# Patient Record
Sex: Female | Born: 1940 | Race: White | Hispanic: No | Smoking: Never smoker
Health system: Southern US, Community
[De-identification: ages and names within clinical notes are randomized; demographics above are authoritative.]

## PROBLEM LIST (undated history)

## (undated) DIAGNOSIS — G4700 Insomnia, unspecified: Secondary | ICD-10-CM

## (undated) DIAGNOSIS — L905 Scar conditions and fibrosis of skin: Secondary | ICD-10-CM

## (undated) DIAGNOSIS — H18519 Endothelial corneal dystrophy, unspecified eye: Secondary | ICD-10-CM

## (undated) DIAGNOSIS — D692 Other nonthrombocytopenic purpura: Secondary | ICD-10-CM

## (undated) DIAGNOSIS — E785 Hyperlipidemia, unspecified: Secondary | ICD-10-CM

## (undated) DIAGNOSIS — H8110 Benign paroxysmal vertigo, unspecified ear: Secondary | ICD-10-CM

## (undated) DIAGNOSIS — R0789 Other chest pain: Secondary | ICD-10-CM

## (undated) DIAGNOSIS — E538 Deficiency of other specified B group vitamins: Secondary | ICD-10-CM

## (undated) DIAGNOSIS — E161 Other hypoglycemia: Secondary | ICD-10-CM

## (undated) DIAGNOSIS — R42 Dizziness and giddiness: Secondary | ICD-10-CM

## (undated) DIAGNOSIS — N3281 Overactive bladder: Secondary | ICD-10-CM

## (undated) DIAGNOSIS — I10 Essential (primary) hypertension: Secondary | ICD-10-CM

## (undated) DIAGNOSIS — R002 Palpitations: Secondary | ICD-10-CM

## (undated) DIAGNOSIS — T792XXA Traumatic secondary and recurrent hemorrhage and seroma, initial encounter: Secondary | ICD-10-CM

## (undated) DIAGNOSIS — C4491 Basal cell carcinoma of skin, unspecified: Secondary | ICD-10-CM

## (undated) DIAGNOSIS — I872 Venous insufficiency (chronic) (peripheral): Secondary | ICD-10-CM

## (undated) DIAGNOSIS — M8580 Other specified disorders of bone density and structure, unspecified site: Secondary | ICD-10-CM

## (undated) DIAGNOSIS — M542 Cervicalgia: Secondary | ICD-10-CM

## (undated) DIAGNOSIS — K2970 Gastritis, unspecified, without bleeding: Secondary | ICD-10-CM

## (undated) DIAGNOSIS — I351 Nonrheumatic aortic (valve) insufficiency: Secondary | ICD-10-CM

## (undated) DIAGNOSIS — R293 Abnormal posture: Secondary | ICD-10-CM

## (undated) DIAGNOSIS — I480 Paroxysmal atrial fibrillation: Secondary | ICD-10-CM

## (undated) DIAGNOSIS — M791 Myalgia, unspecified site: Secondary | ICD-10-CM

## (undated) HISTORY — DX: Nonrheumatic aortic (valve) insufficiency: I35.1

## (undated) HISTORY — DX: Other chest pain: R07.89

## (undated) HISTORY — DX: Insomnia, unspecified: G47.00

## (undated) HISTORY — DX: Other nonthrombocytopenic purpura: D69.2

## (undated) HISTORY — DX: Gastritis, unspecified, without bleeding: K29.70

## (undated) HISTORY — DX: Traumatic secondary and recurrent hemorrhage and seroma, initial encounter: T79.2XXA

## (undated) HISTORY — DX: Overactive bladder: N32.81

## (undated) HISTORY — DX: Endothelial corneal dystrophy, unspecified eye: H18.519

## (undated) HISTORY — DX: Myalgia, unspecified site: M79.10

## (undated) HISTORY — DX: Abnormal posture: R29.3

## (undated) HISTORY — DX: Venous insufficiency (chronic) (peripheral): I87.2

## (undated) HISTORY — DX: Deficiency of other specified B group vitamins: E53.8

## (undated) HISTORY — DX: Essential (primary) hypertension: I10

## (undated) HISTORY — DX: Cervicalgia: M54.2

## (undated) HISTORY — DX: Other specified disorders of bone density and structure, unspecified site: M85.80

## (undated) HISTORY — DX: Hyperlipidemia, unspecified: E78.5

## (undated) HISTORY — DX: Other hypoglycemia: E16.1

## (undated) HISTORY — DX: Benign paroxysmal vertigo, unspecified ear: H81.10

## (undated) HISTORY — PX: AUGMENTATION MAMMAPLASTY: SUR837

## (undated) HISTORY — PX: THYROIDECTOMY, PARTIAL: SHX18

## (undated) HISTORY — DX: Scar conditions and fibrosis of skin: L90.5

## (undated) HISTORY — DX: Palpitations: R00.2

## (undated) HISTORY — DX: Dizziness and giddiness: R42

## (undated) HISTORY — DX: Morbid (severe) obesity due to excess calories: E66.01

## (undated) HISTORY — DX: Paroxysmal atrial fibrillation: I48.0

## (undated) HISTORY — DX: Basal cell carcinoma of skin, unspecified: C44.91

---

## 2020-06-18 LAB — COLOGUARD: COLOGUARD: NEGATIVE

## 2021-11-24 ENCOUNTER — Telehealth: Payer: Self-pay

## 2021-11-24 NOTE — Telephone Encounter (Signed)
Notes scanned to referal

## 2021-11-24 NOTE — Telephone Encounter (Signed)
NOTES SCANNED TO REFERRAL 

## 2021-12-01 ENCOUNTER — Encounter: Payer: Self-pay | Admitting: Internal Medicine

## 2021-12-06 ENCOUNTER — Encounter: Payer: Self-pay | Admitting: Internal Medicine

## 2021-12-06 ENCOUNTER — Other Ambulatory Visit: Payer: Self-pay

## 2021-12-06 ENCOUNTER — Non-Acute Institutional Stay: Payer: Medicare PPO | Admitting: Internal Medicine

## 2021-12-06 VITALS — BP 132/86 | HR 66 | Temp 99.1°F | Wt 133.8 lb

## 2021-12-06 DIAGNOSIS — N3281 Overactive bladder: Secondary | ICD-10-CM

## 2021-12-06 DIAGNOSIS — E538 Deficiency of other specified B group vitamins: Secondary | ICD-10-CM

## 2021-12-06 DIAGNOSIS — M810 Age-related osteoporosis without current pathological fracture: Secondary | ICD-10-CM | POA: Diagnosis not present

## 2021-12-06 DIAGNOSIS — H8110 Benign paroxysmal vertigo, unspecified ear: Secondary | ICD-10-CM | POA: Diagnosis not present

## 2021-12-06 DIAGNOSIS — I480 Paroxysmal atrial fibrillation: Secondary | ICD-10-CM

## 2021-12-06 DIAGNOSIS — H18519 Endothelial corneal dystrophy, unspecified eye: Secondary | ICD-10-CM

## 2021-12-06 NOTE — Progress Notes (Signed)
Location:  Occupational psychologist of Service:  Clinic (12)  Provider:   Code Status:  Goals of Care: No flowsheet data found.   Chief Complaint  Patient presents with   Medication Refill    No new patient packet and or records received.   Patient here today to establish care.    Quality Metric Gaps    Hepatitis C Screening (Once)    Zoster Vaccines- Shingrix (1 of 2) COVID-19 Vaccine (4 - Booster for Moderna series) INFLUENZA VACCINE      HPI: Patient is a 81 y.o. female seen today for medical management of chronic diseases.    Patient has h/o  PAF Symptoms of palpitations and light headedness, no recurrence since diagnosis on monitor and admission to [facility] 5/26-5/27/17 - Stress test suggestive of attenuation, no ischemia or infarct - Dronderone 400mg  PO BID was successful in suppression but resulted in muscle weakness and fatigue EF 55-60% - Mild MR, mild to mod AR - Doing well on Eliquis Flecainide and Toprol Has Appointment with Dr Harrington Challenger to establish Also has h/o Osteopenia has taken Fosamax and Actonel before H/o Vit D and Vit b12 def H/o s/p Partial Thyroidectomy H/o Corneal Dystropy Recent move to [facility] from [place] No Complains today Walks with no assist No Falls Driving.   Past Medical History:  Diagnosis Date   Abnormal posture    Aortic incompetence    Basal cell carcinoma    BPPV (benign paroxysmal positional vertigo)    Chest discomfort    Cobalamin deficiency    Fuchs' corneal dystrophy    Gastritis    HTN (hypertension)    Hyperlipidemia    Insomnia    Light headed    Morbid obesity ([place])    Neck pain    Nonrheumatic aortic (valve) insufficiency    Osteopenia    Overactive bladder    Palpitations    Paroxysmal atrial fibrillation (HCC)    Reactive hypoglycemia    Scar of lip    Senile purpura (HCC)    Seroma due to trauma (HCC)    Tendon pain    Venous insufficiency     History reviewed. No pertinent  surgical history.  Allergies  Allergen Reactions   Codeine Nausea And Vomiting, Other (See Comments) and Nausea Only    Other reaction(s): GI Intolerance Other reaction(s): GI Intolerance Other reaction(s): GI Intolerance    Penicillins Rash and Other (See Comments)    Other reaction(s): Unknown Other reaction(s): Unknown Other reaction(s): Unknown     Outpatient Encounter Medications as of 12/06/2021  Medication Sig   apixaban (ELIQUIS) 5 MG TABS tablet Take 5 mg by mouth 2 (two) times daily.   calcium carbonate (OSCAL) 1500 (600 Ca) MG TABS tablet Take by mouth 2 (two) times daily with a meal.   flecainide (TAMBOCOR) 50 MG tablet Take 50 mg by mouth 2 (two) times daily.   metoprolol succinate (TOPROL-XL) 25 MG 24 hr tablet Take 12.5 mg by mouth daily.   [DISCONTINUED] FEXOFENADINE HCL PO Take by mouth.   [DISCONTINUED] risedronate (ACTONEL) 150 MG tablet Take 150 mg by mouth every 30 (thirty) days. with water on empty stomach, nothing by mouth or lie down for next 30 minutes.   No facility-administered encounter medications on file as of 12/06/2021.    Review of Systems:  Review of Systems  Constitutional:  Negative for activity change and appetite change.  HENT: Negative.    Respiratory:  Negative for cough and shortness  of breath.   Cardiovascular:  Negative for leg swelling.  Gastrointestinal:  Negative for constipation.  Genitourinary: Negative.   Musculoskeletal:  Negative for arthralgias, gait problem and myalgias.  Skin: Negative.   Neurological:  Negative for dizziness and weakness.  Psychiatric/Behavioral:  Negative for confusion, dysphoric mood and sleep disturbance.    Health Maintenance  Topic Date Due   Hepatitis C Screening  Never done   Zoster Vaccines- Shingrix (1 of 2) Never done   DEXA SCAN  Never done   COVID-19 Vaccine (4 - Booster for Moderna series) 11/26/2020   INFLUENZA VACCINE  06/28/2021   TETANUS/TDAP  07/11/2027   Pneumonia Vaccine 65+ Years  old  Completed   HPV VACCINES  Aged Out    Physical Exam: Vitals:   12/06/21 0900  BP: 132/86  Pulse: 66  Temp: 99.1 F (37.3 C)  SpO2: 99%  Weight: 133 lb 12.8 oz (60.7 kg)   There is no height or weight on file to calculate BMI. Physical Exam Vitals reviewed.  Constitutional:      Appearance: Normal appearance.  HENT:     Head: Normocephalic.     Nose: Nose normal.     Mouth/Throat:     Mouth: Mucous membranes are moist.     Pharynx: Oropharynx is clear.  Eyes:     Pupils: Pupils are equal, round, and reactive to light.  Cardiovascular:     Rate and Rhythm: Normal rate and regular rhythm.     Pulses: Normal pulses.     Heart sounds: Normal heart sounds. No murmur heard. Pulmonary:     Effort: Pulmonary effort is normal.     Breath sounds: Normal breath sounds.  Abdominal:     General: Abdomen is flat. Bowel sounds are normal.     Palpations: Abdomen is soft.  Musculoskeletal:        General: No swelling.     Cervical back: Neck supple.  Skin:    General: Skin is warm.  Neurological:     General: No focal deficit present.     Mental Status: She is alert and oriented to person, place, and time.  Psychiatric:        Mood and Affect: Mood normal.        Thought Content: Thought content normal.    Labs reviewed: Basic Metabolic Panel: No results for input(s): NA, K, CL, CO2, GLUCOSE, BUN, CREATININE, CALCIUM, MG, PHOS, TSH in the last 8760 hours. Liver Function Tests: No results for input(s): AST, ALT, ALKPHOS, BILITOT, PROT, ALBUMIN in the last 8760 hours. No results for input(s): LIPASE, AMYLASE in the last 8760 hours. No results for input(s): AMMONIA in the last 8760 hours. CBC: No results for input(s): WBC, NEUTROABS, HGB, HCT, MCV, PLT in the last 8760 hours. Lipid Panel: No results for input(s): CHOL, HDL, LDLCALC, TRIG, CHOLHDL, LDLDIRECT in the last 8760 hours. No results found for: HGBA1C  Procedures since last visit: No results  found.  Assessment/Plan 1. PAF (paroxysmal atrial fibrillation) ([place]) Doing well Follow up with Cardiology  2. Age-related osteoporosis without current pathological fracture Has taken Fosamax before - DG BONE DENSITY (DXA); Future  3. Fuchs' corneal dystrophy, unspecified laterality Follows with ophthalmologist  4. h/o BPPV   5. OAB (overactive bladder)   6. h/o Vitamin B 12 deficiency Check the level 7 S/P Thyroidectomy Check TSH   Labs/tests ordered:  * No order type specified * Next appt:  Visit date not found

## 2021-12-06 NOTE — Patient Instructions (Addendum)
I am ordering DEXA scan from [facility]  Address: [address] [place], [place], [place] [address] Phone: [phone]

## 2021-12-28 NOTE — Addendum Note (Signed)
Addended by: Georgina Snell on: 12/28/2021 10:44 AM   Modules accepted: Level of Service

## 2022-01-13 ENCOUNTER — Other Ambulatory Visit: Payer: Self-pay

## 2022-01-13 ENCOUNTER — Ambulatory Visit: Payer: Medicare PPO | Admitting: Internal Medicine

## 2022-01-13 ENCOUNTER — Encounter: Payer: Self-pay | Admitting: Internal Medicine

## 2022-01-13 VITALS — BP 110/78 | HR 66 | Ht 65.0 in | Wt 131.0 lb

## 2022-01-13 DIAGNOSIS — Z79899 Other long term (current) drug therapy: Secondary | ICD-10-CM | POA: Diagnosis not present

## 2022-01-13 DIAGNOSIS — I480 Paroxysmal atrial fibrillation: Secondary | ICD-10-CM

## 2022-01-13 LAB — BASIC METABOLIC PANEL
BUN/Creatinine Ratio: 18 (ref 12–28)
BUN: 17 mg/dL (ref 8–27)
CO2: 27 mmol/L (ref 20–29)
Calcium: 10.1 mg/dL (ref 8.7–10.3)
Chloride: 100 mmol/L (ref 96–106)
Creatinine, Ser: 0.93 mg/dL (ref 0.57–1.00)
Glucose: 83 mg/dL (ref 70–99)
Potassium: 5.2 mmol/L (ref 3.5–5.2)
Sodium: 139 mmol/L (ref 134–144)
eGFR: 63 mL/min/{1.73_m2} (ref >59)

## 2022-01-13 LAB — LIPID PANEL
Chol/HDL Ratio: 1.6 ratio (ref 0.0–4.4)
Cholesterol, Total: 148 mg/dL (ref 100–199)
HDL: 91 mg/dL (ref >39)
LDL Chol Calc (NIH): 43 mg/dL (ref 0–99)
Triglycerides: 68 mg/dL (ref 0–149)
VLDL Cholesterol Cal: 14 mg/dL (ref 5–40)

## 2022-01-13 LAB — CBC
Hematocrit: 40.7 % (ref 34.0–46.6)
Hemoglobin: 13.4 g/dL (ref 11.1–15.9)
MCH: 29.2 pg (ref 26.6–33.0)
MCHC: 32.9 g/dL (ref 31.5–35.7)
MCV: 89 fL (ref 79–97)
Platelets: 227 10*3/uL (ref 150–450)
RBC: 4.59 x10E6/uL (ref 3.77–5.28)
RDW: 13.1 % (ref 11.7–15.4)
WBC: 8.6 10*3/uL (ref 3.4–10.8)

## 2022-01-13 LAB — TSH: TSH: 2.25 u[IU]/mL (ref 0.450–4.500)

## 2022-01-13 NOTE — Patient Instructions (Signed)
Medication Instructions:  Your physician recommends that you continue on your current medications as directed. Please refer to the Current Medication list given to you today.  *If you need a refill on your cardiac medications before your next appointment, please call your pharmacy*   Lab Work: LIPID, CBC, BMET,TSH  If you have labs (blood work) drawn today and your tests are completely normal, you will receive your results only by: [place] (if you have MyChart) OR A paper copy in the mail If you have any lab test that is abnormal or we need to change your treatment, we will call you to review the results.   Testing/Procedures: NONE   Follow-Up: At [facility], you and your health needs are our priority.  As part of our continuing mission to provide you with exceptional heart care, we have created designated Provider Care Teams.  These Care Teams include your primary Cardiologist (physician) and Advanced Practice Providers (APPs -  Physician Assistants and Nurse Practitioners) who all work together to provide you with the care you need, when you need it.  We recommend signing up for the patient portal called "MyChart".  Sign up information is provided on this After Visit Summary.  MyChart is used to connect with patients for Virtual Visits (Telemedicine).  Patients are able to view lab/test results, encounter notes, upcoming appointments, etc.  Non-urgent messages can be sent to your provider as well.   To learn more about what you can do with MyChart, go to [email or web address].    Your next appointment:   9 month(s)  The format for your next appointment:   In Person  Provider:  DR. Dorris Carnes   If primary card or EP is not listed click here to update    :1}    Other Instructions

## 2022-01-13 NOTE — Progress Notes (Signed)
Cardiology Office Note   Date:  01/13/2022   ID:  Cathy Simpson, DOB October 17, 1941, MRN [id number]  PCP:  Virgie Dad, MD  Cardiologist:   Dorris Carnes, MD   Patient presents to establish care    History of Present Illness: Cathy Simpson is a 81 y.o. female with a history of PAF, atypical CP, HTN, Venous insufficiency,   She was previously followed by [facility] [place], [place])    The pt was on Multaq in past   Developed weakness Switched to [place] well on this   Pt had a Negative Lexiscan Myoview in AUg 2022 (done since she was on flecanide for 5 years)  The pt says she feels good   No CP Remote episode in 2016.   No SOB    She walks some    Admits she has to exercise more       Current Meds  Medication Sig   apixaban (ELIQUIS) 5 MG TABS tablet Take 5 mg by mouth 2 (two) times daily.   flecainide (TAMBOCOR) 50 MG tablet Take 50 mg by mouth 2 (two) times daily.   metoprolol succinate (TOPROL-XL) 25 MG 24 hr tablet Take 12.5 mg by mouth daily.   [DISCONTINUED] calcium carbonate (OSCAL) 1500 (600 Ca) MG TABS tablet Take by mouth 2 (two) times daily with a meal.     Allergies:   Codeine and Penicillins   Past Medical History:  Diagnosis Date   Abnormal posture    Aortic incompetence    Basal cell carcinoma    BPPV (benign paroxysmal positional vertigo)    Chest discomfort    Cobalamin deficiency    Fuchs' corneal dystrophy    Gastritis    HTN (hypertension)    Hyperlipidemia    Insomnia    Light headed    Morbid obesity ([place])    Neck pain    Nonrheumatic aortic (valve) insufficiency    Osteopenia    Overactive bladder    Palpitations    Paroxysmal atrial fibrillation (HCC)    Reactive hypoglycemia    Scar of lip    Senile purpura (HCC)    Seroma due to trauma (HCC)    Tendon pain    Venous insufficiency     No past surgical history on file.   Social History:  The patient  reports that she has never smoked. She has never used  smokeless tobacco. She reports current alcohol use. She reports that she does not use drugs.   Family History:  The patient's family history is not on file.    ROS:  Please see the history of present illness. All other systems are reviewed and  Negative to the above problem except as noted.    PHYSICAL EXAM: VS:  BP 110/78    Pulse 66    Ht 5\' 5"  (1.651 m)    Wt 131 lb (59.4 kg)    BMI 21.80 kg/m   GEN: Well nourished, well developed, in no acute distress  HEENT: normal  Neck: no JVD, carotid bruits Cardiac: RRR; no murmurs  No LE edema  Respiratory:  clear to auscultation bilaterally, GI: soft, nontender, nondistended, + BS  No hepatomegaly  MS: no deformity Moving all extremities   Skin: warm and dry, no rash Neuro:  Strength and sensation are intact Psych: euthymic mood, full affect   EKG:  EKG is ordered today.  SR 66 bpm     Septal MI    Cardiac  Testing :   Echo May 2017 ([place])   LVEF 55 to 24^  normal diastolic funciton.  Trace MR   Exercise myoview  June 2017  Small anteiror attenuation   LVEF 50%  Lipid Panel No results found for: CHOL, TRIG, HDL, CHOLHDL, VLDL, LDLCALC, LDLDIRECT    Wt Readings from Last 3 Encounters:  01/13/22 131 lb (59.4 kg)  12/06/21 133 lb 12.8 oz (60.7 kg)      ASSESSMENT AND PLAN:  1  Hx PAF   Doing well   No palpitations   Would check CBC and BMET   2  Blood pressure   BP controlled   3  Hx CP  Denies    F/U in Fall    Current medicines are reviewed at length with the patient today.  The patient does not have concerns regarding medicines.  Signed, Dorris Carnes, MD  01/13/2022 10:18 AM    [address], [place], [place]  [address] Phone: [phone]; Fax: [phone]

## 2022-04-14 ENCOUNTER — Other Ambulatory Visit: Payer: Self-pay

## 2022-04-14 ENCOUNTER — Encounter (HOSPITAL_BASED_OUTPATIENT_CLINIC_OR_DEPARTMENT_OTHER): Payer: Self-pay | Admitting: *Deleted

## 2022-04-14 ENCOUNTER — Emergency Department (HOSPITAL_BASED_OUTPATIENT_CLINIC_OR_DEPARTMENT_OTHER): Payer: Medicare PPO

## 2022-04-14 ENCOUNTER — Emergency Department (HOSPITAL_BASED_OUTPATIENT_CLINIC_OR_DEPARTMENT_OTHER)
Admission: EM | Admit: 2022-04-14 | Discharge: 2022-04-14 | Disposition: A | Discharge status: Home / Self Care | Payer: Medicare PPO | Attending: Emergency Medicine | Admitting: Emergency Medicine

## 2022-04-14 DIAGNOSIS — I4891 Unspecified atrial fibrillation: Secondary | ICD-10-CM | POA: Diagnosis not present

## 2022-04-14 DIAGNOSIS — M79605 Pain in left leg: Secondary | ICD-10-CM | POA: Diagnosis present

## 2022-04-14 DIAGNOSIS — M79662 Pain in left lower leg: Secondary | ICD-10-CM | POA: Diagnosis not present

## 2022-04-14 DIAGNOSIS — Z7901 Long term (current) use of anticoagulants: Secondary | ICD-10-CM | POA: Diagnosis not present

## 2022-04-14 DIAGNOSIS — Z79899 Other long term (current) drug therapy: Secondary | ICD-10-CM | POA: Diagnosis not present

## 2022-04-14 NOTE — Discharge Instructions (Addendum)
Please follow-up with your primary care provider about your visit today and also to discuss your elevated blood pressure.  You may heat and elevate your leg.  Return with any worsening symptoms, especially shortness of breath, palpitations, chest pain, dizziness or loss of consciousness.

## 2022-04-14 NOTE — ED Provider Notes (Signed)
[facility] EM[facility] DEPT Provider Note   CSN: [id number] Arrival date & time: 04/14/22  1545     History  Chief Complaint  Patient presents with   Leg Pain    Cathy Simpson is a 81 y.o. female with a past medical history of atrial fibrillation presenting today with a concern of left lower extremity DVT.  She flew back from [place] on Tuesday and on Wednesday started noting some swelling and pain to her left lower extremity.  Does have a history of venous insufficiency but has never had DVT.  Says the pain is worse when walking.  Denies any shortness of breath, palpitations, chest pain or dizziness.  Of note, on Eliquis for her A-fib.     Leg Pain     Home Medications Prior to Admission medications   Medication Sig Start Date End Date Taking? Authorizing Provider  apixaban (ELIQUIS) 5 MG TABS tablet Take 5 mg by mouth 2 (two) times daily.    [provider]  flecainide (TAMBOCOR) 50 MG tablet Take 50 mg by mouth 2 (two) times daily. 09/12/16   [provider]  metoprolol succinate (TOPROL-XL) 25 MG 24 hr tablet Take 12.5 mg by mouth daily.    [provider]      Allergies    Codeine and Penicillins    Review of Systems   Review of Systems  Physical Exam Updated Vital Signs BP (!) 171/84 (BP Location: Right Arm)   Pulse 68   Temp (!) 97.3 F (36.3 C) (Oral)   Resp 16   Wt 62.6 kg   SpO2 100%   BMI 22.96 kg/m  Physical Exam Vitals and nursing note reviewed.  Constitutional:      Appearance: Normal appearance.  HENT:     Head: Normocephalic and atraumatic.  Eyes:     General: No scleral icterus.    Conjunctiva/sclera: Conjunctivae normal.  Pulmonary:     Effort: Pulmonary effort is normal. No respiratory distress.  Musculoskeletal:     Right lower leg: No edema.     Left lower leg: No edema.     Comments: No appreciable edema to the left lower extremity.  Tenderness to the calf with dorsiflexion, varicose veins  bilaterally.  Mild tenderness to the anterior knee  Skin:    General: Skin is warm and dry.     Findings: No rash.  Neurological:     Mental Status: She is alert.  Psychiatric:        Mood and Affect: Mood normal.    ED Results / Procedures / Treatments   Labs (all labs ordered are listed, but only abnormal results are displayed) Labs Reviewed - No data to display  EKG None  Radiology US Venous Img Lower  Left (DVT Study)  Result Date: 04/14/2022 CLINICAL DATA:  LEFT lower extremity swelling and soreness. Reported history of a recent long flight. EXAM: LEFT LOWER EXTREMITY VENOUS DOPPLER ULTRASOUND TECHNIQUE: Gray-scale sonography with compression, as well as color and duplex ultrasound, were performed to evaluate the deep venous system(s) from the level of the common femoral vein through the popliteal and proximal calf veins. COMPARISON:  None Available. FINDINGS: VENOUS Normal compressibility of the LEFT common femoral, superficial femoral, and popliteal veins, as well as the visualized calf veins. Visualized portions of profunda femoral vein and great saphenous vein unremarkable. No filling defects to suggest DVT on grayscale or color Doppler imaging. Doppler waveforms show normal direction of venous flow, normal respiratory plasticity and response to  augmentation. Limited views of the contralateral common femoral vein are unremarkable. OTHER None. Limitations: none IMPRESSION: No evidence of femoropopliteal DVT within the LEFT lower extremity. Michaelle Birks, MD [facility] Radiology Electronically Signed   By: Michaelle Birks M.D.   On: 04/14/2022 16:48    Procedures Procedures    Medications Ordered in ED Medications - No data to display  ED Course/ Medical Decision Making/ A&P                           Medical Decision Making  This patient presents to the ED for concern of leg swelling and tenderness.  Differential includes but is  not limited to DVT, PAD, venous insufficiency heart failure exacerbation, superficial thrombus, claudication, fracture.     This is not an exhaustive differential.    Past Medical History / Co-morbidities / Social History: A-fib on Eliquis    Physical Exam: Physical exam performed. The pertinent findings include: None  Lab Tests: No labs   Imaging Studies: I ordered imaging studies including DVT study of left lower extremity. I independently visualized and interpreted imaging which showed no abnormalities. I agree with the radiologist interpretation.    Disposition: After consideration of the diagnostic results and the patients response to treatment, I feel that patient does not have an emergent diagnosis that needs to be intervened on today.  DVT study is negative and patient is agreeable to outpatient follow-up as needed.  Low suspicion DVT/PE because she is already on Eliquis anyway.  No appreciable cellulitis or pitting edema.  I suspect patient to be experiencing musculoskeletal pain due to her tour in [place] and uncomfortable positioning on the airplane.  Patient was hypertensive throughout her stay however she says that her blood pressure is always high in the emergency departments and doctors offices.  Says she does not take her blood pressure at home.  I encouraged her to speak with her PCP about elevated blood pressure as it can be dangerous if she walks around with untreated hypertension.  She voiced understanding and will talk to her PCP about it when she follows up about her leg discomfort.  Stable for discharge and ambulated out of the department  Final Clinical Impression(s) / ED Diagnoses Final diagnoses:  Pain of left lower extremity    Rx / DC Orders Results and diagnoses were explained to the patient. Return precautions discussed in full. Patient had no additional questions and expressed complete understanding.   This chart was dictated using voice recognition  software.  Despite best efforts to proofread,  errors can occur which can change the documentation meaning.      Rhae Hammock, PA-C 04/14/22 1710    Malvin Johns, MD 04/14/22 2011

## 2022-04-14 NOTE — ED Triage Notes (Addendum)
Pt traveled back from [place] and arrived Tuesday night and has noted soreness and swelling in left leg and is here to r/o DVT.  No CP or sob. Pt is on elaquis for afib.  Pt notes that the swelling is most in her knee and the pain radiates up and down.

## 2022-04-16 ENCOUNTER — Telehealth: Payer: Self-pay | Admitting: Home Health

## 2022-04-16 DIAGNOSIS — H18509 Unspecified hereditary corneal dystrophies, unspecified eye: Secondary | ICD-10-CM | POA: Insufficient documentation

## 2022-04-16 DIAGNOSIS — E538 Deficiency of other specified B group vitamins: Secondary | ICD-10-CM | POA: Insufficient documentation

## 2022-04-16 DIAGNOSIS — I10 Essential (primary) hypertension: Secondary | ICD-10-CM | POA: Insufficient documentation

## 2022-04-16 DIAGNOSIS — I480 Paroxysmal atrial fibrillation: Secondary | ICD-10-CM | POA: Insufficient documentation

## 2022-04-16 DIAGNOSIS — E890 Postprocedural hypothyroidism: Secondary | ICD-10-CM | POA: Insufficient documentation

## 2022-04-16 DIAGNOSIS — I872 Venous insufficiency (chronic) (peripheral): Secondary | ICD-10-CM | POA: Insufficient documentation

## 2022-04-16 DIAGNOSIS — E785 Hyperlipidemia, unspecified: Secondary | ICD-10-CM | POA: Insufficient documentation

## 2022-04-16 NOTE — Progress Notes (Signed)
Location:  [facility]  POS: Clinic  Provider: Royal Hawthorn, ANP  Code Status:  Goals of Care:     04/18/2022    1:02 PM  Advanced Directives  Does Patient Have a Medical Advance Directive? Yes  Type of Paramedic of [place];Living will  Does patient want to make changes to medical advance directive? No - Patient declined     Chief Complaint  Patient presents with   Acute Visit    Patient returns to the clinic due to Left leg and knee issues.     HPI: Patient is a 81 y.o. female seen today for difficulty walking  She was seen in the ER 04/14/22 for left leg pain after a long flight to [place]. She is on eliquis for CVA risk reduction due to afib. BP was elevated in the ER as well.  DVT was ruled out. No signs of cellulitis were noted. The pain was thought to be musculoskeletal related.  The pain has continued in the left knee area now, some radiation up to the thigh and down to the calf. Mild swelling and warmth at the knee is noted.  Taking tylenol and ice. Minimal improvement. Difficulty walking at night. NO back pain. Pain is associated at the knee and calf area.  No change in B/B habits. No numbness or tingling.  Past Medical History:  Diagnosis Date   Abnormal posture    Aortic incompetence    Basal cell carcinoma    BPPV (benign paroxysmal positional vertigo)    Chest discomfort    Cobalamin deficiency    Fuchs' corneal dystrophy    Gastritis    HTN (hypertension)    Hyperlipidemia    Insomnia    Light headed    Morbid obesity ([place])    Neck pain    Nonrheumatic aortic (valve) insufficiency    Osteopenia    Overactive bladder    Palpitations    Paroxysmal atrial fibrillation (HCC)    Reactive hypoglycemia    Scar of lip    Senile purpura (HCC)    Seroma due to trauma (HCC)    Tendon pain    Venous insufficiency     History reviewed. No pertinent surgical history.  Allergies  Allergen Reactions   Codeine Nausea And  Vomiting, Other (See Comments) and Nausea Only    Other reaction(s): GI Intolerance Other reaction(s): GI Intolerance Other reaction(s): GI Intolerance    Penicillins Rash and Other (See Comments)    Other reaction(s): Unknown Other reaction(s): Unknown Other reaction(s): Unknown     Outpatient Encounter Medications as of 04/18/2022  Medication Sig   apixaban (ELIQUIS) 5 MG TABS tablet Take 5 mg by mouth 2 (two) times daily.   flecainide (TAMBOCOR) 50 MG tablet Take 50 mg by mouth 2 (two) times daily.   metoprolol succinate (TOPROL-XL) 25 MG 24 hr tablet Take 12.5 mg by mouth daily.   predniSONE (DELTASONE) 20 MG tablet Take 1 tablet (20 mg total) by mouth 2 (two) times daily with a meal.   No facility-administered encounter medications on file as of 04/18/2022.    Review of Systems:  Review of Systems  Constitutional:  Positive for activity change. Negative for appetite change, chills, diaphoresis, fatigue, fever and unexpected weight change.  HENT:  Negative for congestion.   Respiratory:  Negative for cough, shortness of breath and wheezing.   Cardiovascular:  Negative for chest pain, palpitations and leg swelling.  Gastrointestinal:  Negative for abdominal distention, abdominal pain, constipation  and diarrhea.  Genitourinary:  Negative for difficulty urinating and dysuria.  Musculoskeletal:  Positive for gait problem and joint swelling. Negative for arthralgias (left knee pain), back pain and myalgias.  Neurological:  Negative for dizziness, tremors, seizures, syncope, facial asymmetry, speech difficulty, weakness, light-headedness, numbness and headaches.  Psychiatric/Behavioral:  Negative for agitation, behavioral problems and confusion.    Health Maintenance  Topic Date Due   Hepatitis C Screening  Never done   Zoster Vaccines- Shingrix (1 of 2) Never done   DEXA SCAN  Never done   COVID-19 Vaccine (4 - Booster for Moderna series) 11/05/2021   INFLUENZA VACCINE   06/28/2022   TETANUS/TDAP  07/11/2027   Pneumonia Vaccine 32+ Years old  Completed   HPV VACCINES  Aged Out    Physical Exam: Vitals:   04/18/22 1301  BP: 138/72  Pulse: 69  Temp: 97.7 F (36.5 C)  SpO2: 97%  Weight: 138 lb 3.2 oz (62.7 kg)  Height: '5\' 5"'$  (1.651 m)   Body mass index is 23 kg/m. Physical Exam Vitals and nursing note reviewed.  Constitutional:      Appearance: Normal appearance.  Musculoskeletal:        General: No signs of injury.     Right knee: Normal.     Left knee: Swelling and effusion present. No deformity, erythema, ecchymosis, lacerations or crepitus. Normal range of motion. Tenderness present over the MCL. Normal alignment. Normal pulse.     Instability Tests: Anterior drawer test negative.     Right lower leg: Normal. No edema.     Left lower leg: Swelling and tenderness (medial) present. No deformity or lacerations. No edema.  Skin:    General: Skin is warm and dry.  Neurological:     Mental Status: She is alert.    Labs reviewed: Basic Metabolic Panel: Recent Labs    01/13/22 1046  NA 139  K 5.2  CL 100  CO2 27  GLUCOSE 83  BUN 17  CREATININE 0.93  CALCIUM 10.1  TSH 2.250   Liver Function Tests: No results for input(s): AST, ALT, ALKPHOS, BILITOT, PROT, ALBUMIN in the last 8760 hours. No results for input(s): LIPASE, AMYLASE in the last 8760 hours. No results for input(s): AMMONIA in the last 8760 hours. CBC: Recent Labs    01/13/22 1046  WBC 8.6  HGB 13.4  HCT 40.7  MCV 89  PLT 227   Lipid Panel: Recent Labs    01/13/22 1046  CHOL 148  HDL 91  LDLCALC 43  TRIG 68  CHOLHDL 1.6   No results found for: HGBA1C  Procedures since last visit: US Venous Img Lower  Left (DVT Study)  Result Date: 04/14/2022 CLINICAL DATA:  LEFT lower extremity swelling and soreness. Reported history of a recent long flight. EXAM: LEFT LOWER EXTREMITY VENOUS DOPPLER ULTRASOUND TECHNIQUE: Gray-scale sonography with compression, as well  as color and duplex ultrasound, were performed to evaluate the deep venous system(s) from the level of the common femoral vein through the popliteal and proximal calf veins. COMPARISON:  None Available. FINDINGS: VENOUS Normal compressibility of the LEFT common femoral, superficial femoral, and popliteal veins, as well as the visualized calf veins. Visualized portions of profunda femoral vein and great saphenous vein unremarkable. No filling defects to suggest DVT on grayscale or color Doppler imaging. Doppler waveforms show normal direction of venous flow, normal respiratory plasticity and response to augmentation. Limited views of the contralateral common femoral vein are unremarkable. OTHER None. Limitations: none IMPRESSION:  No evidence of femoropopliteal DVT within the LEFT lower extremity. Michaelle Birks, MD [facility] Electronically Signed   By: Michaelle Birks M.D.   On: 04/14/2022 16:48    Assessment/Plan  1. Pain and swelling of left knee After a long flight and walking long distances in [place]. Neg doppler. On eliquis.   - predniSONE (DELTASONE) 20 MG tablet; Take 1 tablet (20 mg total) by mouth 2 (two) times daily with a meal.  Dispense: 10 tablet; Refill: 0 F/U with ortho  2. Acute pain of left knee Has mild swelling and warmth. No fall or acute injury. Suspect effusion.  - Ambulatory referral to Orthopedic Surgery   Labs/tests ordered:  * No order type specified * Next appt:  06/08/2022   Total time 72mn:  time greater than 50% of total time spent doing pt counseling and coordination of care

## 2022-04-16 NOTE — Telephone Encounter (Signed)
Patient called after hour line reporting her left leg pain, just returned from [place], having some knee swelling and problem with walking.  She is worried about having a DVT.  She had a negative Doppler on 04/14/2022.  She states her daughter is a hematology nurse and is worried that ultrasound missed a DVT.  She is wondering if she should increase her Eliquis.  She states she has an upcoming appointment with her PCP.  Advised patient to follow-up with her PCP as scheduled, there is no indication for increasing Eliquis.  Advised patient to take as needed Tylenol for pain.

## 2022-04-18 ENCOUNTER — Non-Acute Institutional Stay: Payer: Medicare PPO | Admitting: Adult Health

## 2022-04-18 ENCOUNTER — Encounter: Payer: Self-pay | Admitting: Adult Health

## 2022-04-18 VITALS — BP 138/72 | HR 69 | Temp 97.7°F | Ht 65.0 in | Wt 138.2 lb

## 2022-04-18 DIAGNOSIS — M25462 Effusion, left knee: Secondary | ICD-10-CM

## 2022-04-18 DIAGNOSIS — M25562 Pain in left knee: Secondary | ICD-10-CM

## 2022-04-18 MED ORDER — PREDNISONE 20 MG PO TABS: 20.0000 mg | ORAL_TABLET | Freq: Two times a day (BID) | ORAL | 0 refills | Status: DC

## 2022-04-18 NOTE — Patient Instructions (Signed)
Continue with ice, rest, and tylenol

## 2022-05-20 LAB — HM DEXA SCAN

## 2022-06-02 LAB — BASIC METABOLIC PANEL
BUN: 19 (ref 4–21)
CO2: 29 — AB (ref 13–22)
Chloride: 104 (ref 99–108)
Creatinine: 1 (ref 0.5–1.1)
Glucose: 96
Potassium: 5.7 mEq/L — AB (ref 3.5–5.1)
Sodium: 143 (ref 137–147)

## 2022-06-02 LAB — CBC AND DIFFERENTIAL
HCT: 43 (ref 36–46)
Hemoglobin: 14.2 (ref 12.0–16.0)
Platelets: 254 10*3/uL (ref 150–400)
WBC: 8.8

## 2022-06-02 LAB — TSH: TSH: 2.37 (ref 0.41–5.90)

## 2022-06-02 LAB — HEPATIC FUNCTION PANEL
ALT: 13 U/L (ref 7–35)
AST: 16 (ref 13–35)
Alkaline Phosphatase: 63 (ref 25–125)
Bilirubin, Total: 0.7

## 2022-06-02 LAB — VITAMIN B12: Vitamin B-12: 453

## 2022-06-02 LAB — LIPID PANEL
Cholesterol: 153 (ref 0–200)
HDL: 106 — AB (ref 35–70)
LDL Cholesterol: 34
Triglycerides: 64 (ref 40–160)

## 2022-06-02 LAB — COMPREHENSIVE METABOLIC PANEL
Albumin: 4.5 (ref 3.5–5.0)
Calcium: 10.1 (ref 8.7–10.7)
Globulin: 3

## 2022-06-02 LAB — CBC: RBC: 4.69 (ref 3.87–5.11)

## 2022-06-08 ENCOUNTER — Encounter: Payer: Self-pay | Admitting: Internal Medicine

## 2022-06-08 ENCOUNTER — Non-Acute Institutional Stay: Payer: Medicare PPO | Admitting: Internal Medicine

## 2022-06-08 VITALS — BP 132/84 | HR 62 | Temp 97.3°F | Ht 65.0 in | Wt 136.2 lb

## 2022-06-08 DIAGNOSIS — E890 Postprocedural hypothyroidism: Secondary | ICD-10-CM

## 2022-06-08 DIAGNOSIS — E875 Hyperkalemia: Secondary | ICD-10-CM

## 2022-06-08 DIAGNOSIS — M25562 Pain in left knee: Secondary | ICD-10-CM | POA: Diagnosis not present

## 2022-06-08 DIAGNOSIS — M810 Age-related osteoporosis without current pathological fracture: Secondary | ICD-10-CM

## 2022-06-08 DIAGNOSIS — Z1231 Encounter for screening mammogram for malignant neoplasm of breast: Secondary | ICD-10-CM

## 2022-06-08 DIAGNOSIS — M25551 Pain in right hip: Secondary | ICD-10-CM

## 2022-06-08 DIAGNOSIS — I480 Paroxysmal atrial fibrillation: Secondary | ICD-10-CM

## 2022-06-08 DIAGNOSIS — N3281 Overactive bladder: Secondary | ICD-10-CM

## 2022-06-08 DIAGNOSIS — H8110 Benign paroxysmal vertigo, unspecified ear: Secondary | ICD-10-CM

## 2022-06-08 DIAGNOSIS — H18519 Endothelial corneal dystrophy, unspecified eye: Secondary | ICD-10-CM

## 2022-06-08 NOTE — Progress Notes (Signed)
Location:  [place] of Service:  Clinic (12)  Provider:   Code Status:  Goals of Care:     06/08/2022    8:09 AM  Advanced Directives  Does Patient Have a Medical Advance Directive? Yes  Type of Paramedic of [place];Living will  Does patient want to make changes to medical advance directive? No - Patient declined  Copy of [place] in Chart? No - copy requested      Significant value     Chief Complaint  Patient presents with  . Medical Management of Chronic Issues    Patient presents today for a 6 month follow up.  . Quality Metric Gaps    Discuss the need for Hep C screening, Shingrix vaccine, and additional Covid booster, or post pone if patient refuses.     HPI: Patient is a 81 y.o. female seen today for medical management of chronic diseases.    Patient has h/o  PAF Symptoms of palpitations and light headedness, no recurrence since diagnosis on monitor and admission to [facility] 5/26-5/27/17 - Stress test suggestive of attenuation, no ischemia or infarct - Dronderone '400mg'$  PO BID was successful in suppression but resulted in muscle weakness and fatigue EF 55-60% - Mild MR, mild to mod AR  Follows with Cardiology Doing well   Pain in Left Knee Joint after trip to [place] Injected by Ortho. Has arthritis  New problem with pain in her Right Pelvic area especially when she walks or put weight on that leg  Also has h/o Osteopenia has taken Fosamax and Actonel before H/o Vit D and Vit b12 def H/o s/p Partial Thyroidectomy H/o Corneal Dystropy Recent move to [facility] from [place] Past Medical History:  Diagnosis Date  . Abnormal posture   . Aortic incompetence   . Basal cell carcinoma   . BPPV (benign paroxysmal positional vertigo)   . Chest discomfort   . Cobalamin deficiency   . Fuchs' corneal dystrophy   . Gastritis   . HTN (hypertension)   . Hyperlipidemia   . Insomnia   .  Light headed   . Morbid obesity ([place])   . Neck pain   . Nonrheumatic aortic (valve) insufficiency   . Osteopenia   . Overactive bladder   . Palpitations   . Paroxysmal atrial fibrillation (HCC)   . Reactive hypoglycemia   . Scar of lip   . Senile purpura ([place])   . Seroma due to trauma ([place])   . Tendon pain   . Venous insufficiency     History reviewed. No pertinent surgical history.  Allergies  Allergen Reactions  . Codeine Nausea And Vomiting, Other (See Comments) and Nausea Only    Other reaction(s): GI Intolerance Other reaction(s): GI Intolerance Other reaction(s): GI Intolerance   . Penicillins Rash and Other (See Comments)    Other reaction(s): Unknown Other reaction(s): Unknown Other reaction(s): Unknown     Outpatient Encounter Medications as of 06/08/2022  Medication Sig  . Calcium-Vitamins C & D (CALCIUM/C/D PO) Take by mouth daily.  Marland Kitchen apixaban (ELIQUIS) 5 MG TABS tablet Take 5 mg by mouth 2 (two) times daily.  . flecainide (TAMBOCOR) 50 MG tablet Take 50 mg by mouth 2 (two) times daily.  . metoprolol succinate (TOPROL-XL) 25 MG 24 hr tablet Take 12.5 mg by mouth daily.  . [DISCONTINUED] predniSONE (DELTASONE) 20 MG tablet Take 1 tablet (20 mg total) by mouth 2 (two) times daily with a meal.  No facility-administered encounter medications on file as of 06/08/2022.    Review of Systems:  Review of Systems  Constitutional:  Negative for activity change and appetite change.  HENT: Negative.    Respiratory:  Negative for cough and shortness of breath.   Cardiovascular:  Negative for leg swelling.  Gastrointestinal:  Negative for constipation.  Genitourinary:  Positive for frequency.  Musculoskeletal:  Positive for arthralgias and myalgias. Negative for gait problem.  Skin: Negative.   Neurological:  Negative for dizziness and weakness.  Psychiatric/Behavioral:  Negative for confusion, dysphoric mood and sleep disturbance.     Health Maintenance  Topic  Date Due  . Hepatitis C Screening  Never done  . Zoster Vaccines- Shingrix (1 of 2) Never done  . COVID-19 Vaccine (4 - Booster for Moderna series) 11/05/2021  . INFLUENZA VACCINE  06/28/2022  . TETANUS/TDAP  07/11/2027  . Pneumonia Vaccine 59+ Years old  Completed  . DEXA SCAN  Completed  . HPV VACCINES  Aged Out    Physical Exam: Vitals:   06/08/22 0808  BP: 132/84  Pulse: 62  Temp: (!) 97.3 F (36.3 C)  TempSrc: Temporal  SpO2: 92%  Weight: 136 lb 3.2 oz (61.8 kg)  Height: '5\' 5"'$  (1.651 m)   Body mass index is 22.66 kg/m. Physical Exam Vitals reviewed.  Constitutional:      Appearance: Normal appearance.  HENT:     Head: Normocephalic.     Nose: Nose normal.     Mouth/Throat:     Mouth: Mucous membranes are moist.     Pharynx: Oropharynx is clear.  Eyes:     Pupils: Pupils are equal, round, and reactive to light.  Cardiovascular:     Rate and Rhythm: Normal rate and regular rhythm.     Pulses: Normal pulses.     Heart sounds: Normal heart sounds. No murmur heard. Pulmonary:     Effort: Pulmonary effort is normal.     Breath sounds: Normal breath sounds.  Abdominal:     General: Abdomen is flat. Bowel sounds are normal.     Palpations: Abdomen is soft.  Musculoskeletal:        General: No swelling.     Cervical back: Neck supple.     Comments: Right hip Normal exam with no pain with Abduction or Adduction Does feel stretch in pelvic area when I stretch her legs  Skin:    General: Skin is warm.  Neurological:     General: No focal deficit present.     Mental Status: She is alert and oriented to person, place, and time.  Psychiatric:        Mood and Affect: Mood normal.        Thought Content: Thought content normal.    Labs reviewed: Basic Metabolic Panel: Recent Labs    01/13/22 1046 06/02/22 0000  NA 139 143  K 5.2 5.7*  CL 100 104  CO2 27 29*  GLUCOSE 83  --   BUN 17 19  CREATININE 0.93 1.0  CALCIUM 10.1 10.1  TSH 2.250 2.37   Liver  Function Tests: Recent Labs    06/02/22 0000  AST 16  ALT 13  ALKPHOS 63  ALBUMIN 4.5   No results for input(s): "LIPASE", "AMYLASE" in the last 8760 hours. No results for input(s): "AMMONIA" in the last 8760 hours. CBC: Recent Labs    01/13/22 1046 06/02/22 0000  WBC 8.6 8.8  HGB 13.4 14.2  HCT 40.7 43  MCV 89  --  PLT 227 254   Lipid Panel: Recent Labs    01/13/22 1046 06/02/22 0000  CHOL 148 153  HDL 91 106*  LDLCALC 43 34  TRIG 68 64  CHOLHDL 1.6  --    No results found for: "HGBA1C"  Procedures since last visit: No results found.  Assessment/Plan 1. Encounter for screening mammogram for malignant neoplasm of breast  - MM Digital Screening; Future  2. Hyperkalemia ? Etiology Normal renal Function No Supplements Repeat BMP  3. Pain of right iliac fossa ? Muscular  Aleve QD for 5 Days with Food If pain not better she will call ortho for follow up  4. Acute pain of left knee Better since her Steroid injection  5. PAF (paroxysmal atrial fibrillation) (HCC) Eliquis and Flecainide and Toprol  6. Fuchs' corneal dystrophy, unspecified laterality Follows with Opthlamologist  7. h/o BPPV   8. OAB (overactive bladder) Does not want Meds right now  9. Age-related osteoporosis without current pathological fracture Has been on Meds  10. H/O partial thyroidectomy ***  Labs/tests ordered:  * No order type specified * Next appt:  Visit date not found

## 2022-06-08 NOTE — Patient Instructions (Addendum)
Take Aleve One tab once a day with food for 5 days Can use Arthritis Tylenol 3 times a day for pain If pain not better will need to see Ortho. I will repeat Labs to check your potassium again Mammogram for Screening

## 2022-06-10 ENCOUNTER — Telehealth: Payer: Self-pay

## 2022-06-10 NOTE — Telephone Encounter (Signed)
-----   Message from Virgie Dad, MD sent at 06/09/2022 10:39 AM EDT ----- Can you call this patient and let her know that I reviewed her Bone density and it looks good. She does not need any treatment right now.  Thanks ----- Message ----- From: Logan Bores, CMA Sent: 05/23/2022   3:02 PM EDT To: Virgie Dad, MD

## 2022-06-10 NOTE — Telephone Encounter (Signed)
Called and discussed with the patient.

## 2022-06-21 LAB — TSH: TSH: 2.54 (ref 0.41–5.90)

## 2022-06-21 LAB — BASIC METABOLIC PANEL
BUN: 16 (ref 4–21)
CO2: 29 — AB (ref 13–22)
Chloride: 105 (ref 99–108)
Creatinine: 1 (ref 0.5–1.1)
Glucose: 97
Potassium: 4.1 mEq/L (ref 3.5–5.1)
Sodium: 142 (ref 137–147)

## 2022-06-21 LAB — COMPREHENSIVE METABOLIC PANEL: Calcium: 9.5 (ref 8.7–10.7)

## 2022-07-15 ENCOUNTER — Other Ambulatory Visit: Payer: Self-pay | Admitting: Internal Medicine

## 2022-07-15 MED ORDER — APIXABAN 5 MG PO TABS: 5.0000 mg | ORAL_TABLET | Freq: Two times a day (BID) | ORAL | 5 refills | Status: DC

## 2022-07-15 MED ORDER — FLECAINIDE ACETATE 50 MG PO TABS: 50.0000 mg | ORAL_TABLET | Freq: Two times a day (BID) | ORAL | 1 refills | Status: DC

## 2022-07-15 MED ORDER — METOPROLOL SUCCINATE ER 25 MG PO TB24: 12.5000 mg | ORAL_TABLET | Freq: Every day | ORAL | 1 refills | Status: DC

## 2022-07-15 NOTE — Telephone Encounter (Signed)
Pt's medication was sent to pt's pharmacy as requested. Confirmation received.  °

## 2022-07-15 NOTE — Telephone Encounter (Signed)
Prescription refill request for Eliquis received. Indication: afib  Last office visit: Harrington Challenger 01/13/2022 Scr: 1.0, 06/21/2022 Age: 81 yo Weight: 61.8 kg

## 2022-07-15 NOTE — Telephone Encounter (Signed)
*  STAT* If patient is at the pharmacy, call can be transferred to refill team.   1. Which medications need to be refilled? (please list name of each medication and dose if known) apixaban (ELIQUIS) 5 MG TABS tablet flecainide (TAMBOCOR) 50 MG tablet metoprolol succinate (TOPROL-XL) 25 MG 24 hr tablet 2. Which pharmacy/location (including street and city if local pharmacy) is medication to be sent to? [id number] [place] [facility] [address]  3. Do they need a 30 day or 90 day supply? 90  Pt made a f/u for 10/27/22 with Dr. RHarrington Challenger

## 2022-08-17 ENCOUNTER — Ambulatory Visit (INDEPENDENT_AMBULATORY_CARE_PROVIDER_SITE_OTHER): Payer: Medicare PPO | Admitting: Family Medicine

## 2022-08-17 ENCOUNTER — Encounter: Payer: Self-pay | Admitting: Family Medicine

## 2022-08-17 VITALS — BP 150/100 | HR 85 | Temp 97.9°F | Ht 65.0 in | Wt 134.0 lb

## 2022-08-17 DIAGNOSIS — B349 Viral infection, unspecified: Secondary | ICD-10-CM | POA: Diagnosis not present

## 2022-08-17 DIAGNOSIS — R059 Cough, unspecified: Secondary | ICD-10-CM

## 2022-08-17 LAB — POCT INFLUENZA A/B
Influenza A, POC: NEGATIVE
Influenza B, POC: NEGATIVE

## 2022-08-17 NOTE — Progress Notes (Signed)
Provider:  Alain Honey, MD  Careteam: Patient Care Team: Virgie Dad, MD as PCP - General (Internal Medicine)  PLACE OF SERVICE:  [place]  Advanced Directive information    Allergies  Allergen Reactions   Codeine Nausea And Vomiting, Other (See Comments) and Nausea Only    Other reaction(s): GI Intolerance Other reaction(s): GI Intolerance Other reaction(s): GI Intolerance    Penicillins Rash and Other (See Comments)    Other reaction(s): Unknown Other reaction(s): Unknown Other reaction(s): Unknown     No chief complaint on file.    HPI: Patient is a 81 y.o. female patient presents with chief complaint of sore throat cough and fever.  Was exposed to [place] within the last week by a person on her hall at [place].  Historically, she had COVID 5 to 6 weeks ago with chills headaches myalgias and sore throat.  She did take an expired rapid home COVID test this morning that was negative.  Review of Systems:  Review of Systems  Constitutional:  Positive for fever.  HENT:  Positive for sore throat.   Respiratory:  Positive for cough.   Musculoskeletal:  Positive for myalgias.    Past Medical History:  Diagnosis Date   Abnormal posture    Aortic incompetence    Basal cell carcinoma    BPPV (benign paroxysmal positional vertigo)    Chest discomfort    Cobalamin deficiency    Fuchs' corneal dystrophy    Gastritis    HTN (hypertension)    Hyperlipidemia    Insomnia    Light headed    Morbid obesity ([place])    Neck pain    Nonrheumatic aortic (valve) insufficiency    Osteopenia    Overactive bladder    Palpitations    Paroxysmal atrial fibrillation (HCC)    Reactive hypoglycemia    Scar of lip    Senile purpura (HCC)    Seroma due to trauma (HCC)    Tendon pain    Venous insufficiency    No past surgical history on file. Social History:   reports that she has never smoked. She has never used smokeless tobacco. She reports current alcohol use.  She reports that she does not use drugs.  No family history on file.  Medications: Patient's Medications  New Prescriptions   No medications on file  Previous Medications   APIXABAN (ELIQUIS) 5 MG TABS TABLET    Take 1 tablet (5 mg total) by mouth 2 (two) times daily.   CALCIUM-VITAMINS C & D (CALCIUM/C/D PO)    Take by mouth daily.   FLECAINIDE (TAMBOCOR) 50 MG TABLET    Take 1 tablet (50 mg total) by mouth 2 (two) times daily.   METOPROLOL SUCCINATE (TOPROL-XL) 25 MG 24 HR TABLET    Take 0.5 tablets (12.5 mg total) by mouth daily.  Modified Medications   No medications on file  Discontinued Medications   No medications on file    Physical Exam:  There were no vitals filed for this visit. There is no height or weight on file to calculate BMI. Wt Readings from Last 3 Encounters:  06/08/22 136 lb 3.2 oz (61.8 kg)  04/18/22 138 lb 3.2 oz (62.7 kg)  04/14/22 138 lb (62.6 kg)    Physical Exam Vitals and nursing note reviewed.  Constitutional:      Appearance: Normal appearance.  HENT:     Right Ear: Tympanic membrane normal.     Left Ear: Tympanic membrane normal.  Mouth/Throat:     Mouth: Mucous membranes are moist.     Pharynx: Oropharynx is clear. No posterior oropharyngeal erythema.  Cardiovascular:     Rate and Rhythm: Normal rate and regular rhythm.  Pulmonary:     Effort: Pulmonary effort is normal.     Breath sounds: Normal breath sounds.  Neurological:     General: No focal deficit present.     Mental Status: She is alert and oriented to person, place, and time.     Labs reviewed: Basic Metabolic Panel: Recent Labs    01/13/22 1046 06/02/22 0000 06/21/22 0000  NA 139 143 142  K 5.2 5.7* 4.1  CL 100 104 105  CO2 27 29* 29*  GLUCOSE 83  --   --   BUN '17 19 16  '$ CREATININE 0.93 1.0 1.0  CALCIUM 10.1 10.1 9.5  TSH 2.250 2.37 2.54   Liver Function Tests: Recent Labs    06/02/22 0000  AST 16  ALT 13  ALKPHOS 63  ALBUMIN 4.5   No results for  input(s): "LIPASE", "AMYLASE" in the last 8760 hours. No results for input(s): "AMMONIA" in the last 8760 hours. CBC: Recent Labs    01/13/22 1046 06/02/22 0000  WBC 8.6 8.8  HGB 13.4 14.2  HCT 40.7 43  MCV 89  --   PLT 227 254   Lipid Panel: Recent Labs    01/13/22 1046 06/02/22 0000  CHOL 148 153  HDL 91 106*  LDLCALC 43 34  TRIG 68 64  CHOLHDL 1.6  --    TSH: Recent Labs    01/13/22 1046 06/02/22 0000 06/21/22 0000  TSH 2.250 2.37 2.54   A1C: No results found for: "HGBA1C"   Assessment/Plan  1. Viral illness Rapid flu test was negative.  I think there is a possibility of COVID even though she had infection 5 to 6 weeks ago when she did have antibody levels that protect her.  We do not have current access to timely COVID testing so I have recommended she pick up a rapid test at any of the local pharmacies and do tests for herself.  If positive, she is not at high risk except for her age in terms of treatment.  She should however quarantine herself for at least 5-7 if not 10 days   Alain Honey, MD [place] [phone]

## 2022-08-19 ENCOUNTER — Other Ambulatory Visit: Payer: Self-pay | Admitting: Orthopedic Surgery

## 2022-08-19 ENCOUNTER — Telehealth: Payer: Self-pay

## 2022-08-19 DIAGNOSIS — U071 COVID-19: Secondary | ICD-10-CM

## 2022-08-19 MED ORDER — VITAMIN C 1000 MG PO TABS: 1000.0000 mg | ORAL_TABLET | Freq: Every day | ORAL | 0 refills | Status: DC

## 2022-08-19 MED ORDER — BENZONATATE 100 MG PO CAPS: 100.0000 mg | ORAL_CAPSULE | Freq: Two times a day (BID) | ORAL | 0 refills | Status: DC | PRN

## 2022-08-19 MED ORDER — SALINE SPRAY 0.65 % NA SOLN: 1.0000 | NASAL | 0 refills | Status: DC | PRN

## 2022-08-19 MED ORDER — LORATADINE 10 MG PO TABS: 10.0000 mg | ORAL_TABLET | Freq: Every day | ORAL | 11 refills | Status: DC

## 2022-08-19 MED ORDER — ZINC 50 MG PO TABS: 50.0000 mg | ORAL_TABLET | Freq: Every day | ORAL | 0 refills | Status: AC

## 2022-08-19 NOTE — Telephone Encounter (Addendum)
Autumn, [facility] Nurse, called and stated that patient was seen yesterday with Covid Like Symptoms.   Patient has a terrible cough and requesting something to be called in for it. Also stated that she has lost 6lbs in the past 2 days.   Please Advise. (Forwarded to Amy due to Dr. Sabra Heck out of office)

## 2022-08-19 NOTE — Telephone Encounter (Signed)
[facility] clinical nurse called requesting a medication to be send into pharmacy for patient. She reports that patient is having the following symptoms: runny nose, cough, body aches, fever (101.0) and lost 6 lbs in the past 2 days. Patient was seen on 08/17/22 by Dr.Miller and the nurse was advised that pt declined taking any medication for her symptoms at the appointment on 08/17/22. But now the is stating that she would like a medication send into the pharmacy for treatment. Patient reported she had COVID 5 weeks ago at her appointment with Dr. Sabra Heck and didn't think at the time of the appointment that it was COVID again so didn't want to get the COVID test done but she did have the flu test since it was a rapid test. Advised that a message would have to be send to [place].

## 2022-08-19 NOTE — Telephone Encounter (Signed)
I have spoken with Cathy Simpson, [facility] nurse, she is going to make house visit and call me with update. We plan to do another covid test during visit.

## 2022-08-19 NOTE — Progress Notes (Signed)
Patient covid positive. Symptoms include mild fatigue, runny nose and dry cough. Patient does not want paxlovid at this time. [facility]. Advised to contact [facility] or [facility] if symptoms worsen.

## 2022-09-11 ENCOUNTER — Other Ambulatory Visit: Payer: Self-pay | Admitting: Orthopedic Surgery

## 2022-09-11 DIAGNOSIS — U071 COVID-19: Secondary | ICD-10-CM

## 2022-09-12 NOTE — Telephone Encounter (Signed)
Patient has request 90 day supply on Vitamin C. Medication has warnings. Medication pend and sent to PCP Virgie Dad, MD for approval.

## 2022-09-15 ENCOUNTER — Other Ambulatory Visit (HOSPITAL_BASED_OUTPATIENT_CLINIC_OR_DEPARTMENT_OTHER): Payer: Self-pay

## 2022-09-15 MED ORDER — INFLUENZA VAC A&B SA ADJ QUAD 0.5 ML IM PRSY
PREFILLED_SYRINGE | INTRAMUSCULAR | 0 refills | Status: DC
  Filled 2022-09-15: qty 0.5, 1d supply, fill #0

## 2022-09-16 ENCOUNTER — Emergency Department (HOSPITAL_BASED_OUTPATIENT_CLINIC_OR_DEPARTMENT_OTHER)
Admission: EM | Admit: 2022-09-16 | Discharge: 2022-09-16 | Disposition: A | Discharge status: Home / Self Care | Payer: Medicare PPO | Attending: Emergency Medicine | Admitting: Emergency Medicine

## 2022-09-16 ENCOUNTER — Emergency Department (HOSPITAL_BASED_OUTPATIENT_CLINIC_OR_DEPARTMENT_OTHER): Payer: Medicare PPO

## 2022-09-16 ENCOUNTER — Other Ambulatory Visit: Payer: Self-pay

## 2022-09-16 ENCOUNTER — Encounter (HOSPITAL_BASED_OUTPATIENT_CLINIC_OR_DEPARTMENT_OTHER): Payer: Self-pay | Admitting: Emergency Medicine

## 2022-09-16 ENCOUNTER — Ambulatory Visit: Payer: Medicare PPO | Admitting: Adult Health

## 2022-09-16 DIAGNOSIS — I10 Essential (primary) hypertension: Secondary | ICD-10-CM | POA: Insufficient documentation

## 2022-09-16 DIAGNOSIS — M7989 Other specified soft tissue disorders: Secondary | ICD-10-CM | POA: Diagnosis not present

## 2022-09-16 DIAGNOSIS — Z7901 Long term (current) use of anticoagulants: Secondary | ICD-10-CM | POA: Insufficient documentation

## 2022-09-16 DIAGNOSIS — Z79899 Other long term (current) drug therapy: Secondary | ICD-10-CM | POA: Insufficient documentation

## 2022-09-16 DIAGNOSIS — M79604 Pain in right leg: Secondary | ICD-10-CM | POA: Insufficient documentation

## 2022-09-16 LAB — CBC
HCT: 38.8 % (ref 36.0–46.0)
Hemoglobin: 12.8 g/dL (ref 12.0–15.0)
MCH: 29.6 pg (ref 26.0–34.0)
MCHC: 33 g/dL (ref 30.0–36.0)
MCV: 89.8 fL (ref 80.0–100.0)
Platelets: 188 10*3/uL (ref 150–400)
RBC: 4.32 MIL/uL (ref 3.87–5.11)
RDW: 13 % (ref 11.5–15.5)
WBC: 8 10*3/uL (ref 4.0–10.5)
nRBC: 0 % (ref 0.0–0.2)

## 2022-09-16 LAB — BASIC METABOLIC PANEL
Anion gap: 8 (ref 5–15)
BUN: 14 mg/dL (ref 8–23)
CO2: 29 mmol/L (ref 22–32)
Calcium: 9.9 mg/dL (ref 8.9–10.3)
Chloride: 102 mmol/L (ref 98–111)
Creatinine, Ser: 0.91 mg/dL (ref 0.44–1.00)
GFR, Estimated: >60 mL/min (ref >60)
Glucose, Bld: 76 mg/dL (ref 70–99)
Potassium: 3.7 mmol/L (ref 3.5–5.1)
Sodium: 139 mmol/L (ref 135–145)

## 2022-09-16 LAB — CK: Total CK: 112 U/L (ref 38–234)

## 2022-09-16 NOTE — ED Notes (Signed)
Pt ambulatory to restroom

## 2022-09-16 NOTE — ED Triage Notes (Signed)
Pt arrives to ED with c/o right leg swelling and pain for x1-2 weeks. She reports right calf pain when she walks that is relieved by rest.

## 2022-09-16 NOTE — Discharge Instructions (Addendum)
Your labs and ultrasound were reassuring today. Please rest your leg, use compression socks, take tylenol, use ice or heat pads for pain. I recommend close follow-up with your PCP for further reevaluation if symptom persists.  Please do not hesitate to return to emergency department if worrisome signs symptoms we discussed become apparent.

## 2022-09-16 NOTE — ED Provider Notes (Signed)
[facility] EMERGENCY DEPT Provider Note   CSN: [id number] Arrival date & time: 09/16/22  1015     History {Add pertinent medical, surgical, social history, OB history to HPI:1} Chief Complaint  Patient presents with   Leg Swelling    Cathy Simpson is a 81 y.o. female with a past medical history of A-fib, hypertension presenting to the emergency department for evaluation of right leg pain.  Patient states she has had pain in the back of her entire right leg and ankle in the last week.  She noted mild swelling on the right leg.  Reports pain with walking and laying on the right side.  States she got back on the cycling machine about 10 days ago but was not sure if she actually injured her leg.  Denies any fall.  Denies any recent surgery or travel.  Denies shortness of breath, chest pain, fever, nausea, vomiting, bowel changes, urinary symptoms.  She did have unilateral left leg pain and swelling in the past when they had a she had A-fib and put on Eliquis.  HPI     Home Medications Prior to Admission medications   Medication Sig Start Date End Date Taking? Authorizing Provider  [employer] VITAMIN C 1000 MG tablet TAKE 1 TABLET BY MOUTH EVERY DAY 09/12/22   Virgie Dad, MD  apixaban (ELIQUIS) 5 MG TABS tablet Take 1 tablet (5 mg total) by mouth 2 (two) times daily. 07/15/22   Fay Records, MD  benzonatate (TESSALON) 100 MG capsule Take 1 capsule (100 mg total) by mouth 2 (two) times daily as needed for cough. 08/19/22   Fargo, Amy E, NP  Calcium-Vitamins C & D (CALCIUM/C/D PO) Take by mouth daily.    [provider]  flecainide (TAMBOCOR) 50 MG tablet Take 1 tablet (50 mg total) by mouth 2 (two) times daily. 07/15/22   Fay Records, MD  influenza vaccine adjuvanted (FLUAD) 0.5 ML injection Inject into the muscle. 09/15/22   Carlyle Basques, MD  loratadine (CLARITIN) 10 MG tablet Take 1 tablet (10 mg total) by mouth daily. 08/19/22   Fargo, Amy E, NP  metoprolol  succinate (TOPROL-XL) 25 MG 24 hr tablet Take 0.5 tablets (12.5 mg total) by mouth daily. 07/15/22   Fay Records, MD  sodium chloride (OCEAN) 0.65 % SOLN nasal spray Place 1 spray into both nostrils as needed for congestion. 08/19/22   Fargo, Amy E, NP  Zinc 50 MG TABS Take 1 tablet (50 mg total) by mouth daily. 08/19/22 09/18/22  Yvonna Alanis, NP      Allergies    Codeine and Penicillins    Review of Systems   Review of Systems  Musculoskeletal:        Right leg pain and swelling.    Physical Exam Updated Vital Signs BP (!) 184/89 (BP Location: Right Arm)   Pulse 77   Temp 98.2 F (36.8 C) (Oral)   Resp 15   Ht '5\' 5"'$  (1.651 m)   Wt 59.9 kg   SpO2 97%   BMI 21.97 kg/m  Physical Exam Vitals and nursing note reviewed.  Constitutional:      Appearance: Normal appearance.  HENT:     Head: Normocephalic and atraumatic.     Mouth/Throat:     Mouth: Mucous membranes are moist.  Eyes:     General: No scleral icterus. Cardiovascular:     Rate and Rhythm: Normal rate and regular rhythm.     Pulses: Normal pulses.  Heart sounds: Normal heart sounds.  Pulmonary:     Effort: Pulmonary effort is normal.     Breath sounds: Normal breath sounds.  Abdominal:     General: Abdomen is flat.     Palpations: Abdomen is soft.     Tenderness: There is no abdominal tenderness.  Musculoskeletal:        General: No deformity.     Comments: TTP to the back of right leg.  Mild swelling on left ankle and calf.  Left legs appear normal.  Skin:    General: Skin is warm.     Findings: No rash.  Neurological:     General: No focal deficit present.     Mental Status: She is alert.  Psychiatric:        Mood and Affect: Mood normal.     ED Results / Procedures / Treatments   Labs (all labs ordered are listed, but only abnormal results are displayed) Labs Reviewed - No data to display  EKG None  Radiology No results found.  Procedures Procedures  {Document cardiac monitor,  telemetry assessment procedure when appropriate:1}  Medications Ordered in ED Medications - No data to display  ED Course/ Medical Decision Making/ A&P                           Medical Decision Making  This patient presents to the ED for concern of right leg pain and swelling, this involves an extensive number of treatment options, and is a complaint that carries with it a high risk of complications and morbidity.  The differential diagnosis includes DVT, PE, cellulitis, compartment syndrome, peripheral vascular disease, fracture, dislocation. Co morbidities that complicate the patient evaluation  See HPI Additional history obtained:  Additional history obtained from EMR External records from outside source obtained and reviewed including Care Everywhere/External Records and Primary Care Documents Lab Tests:  I Ordered, and personally interpreted labs.  The pertinent results include:   No leukocytosis noted.   No evidence of anemia.   Platelets within normal range.   No electrolyte abnormalities noted.  Renal function within normal limits.    Imaging Studies ordered:  I ordered imaging studies including: Ultrasound venous lower unilateral right which showed no evidence of deep vein thrombosis. I independently visualized and interpreted imaging. I agree with the radiologist interpretation Cardiac Monitoring: / EKG:  The patient was maintained on a cardiac monitor.  I personally viewed and interpreted the cardiac monitored which showed an underlying rhythm of: sinus rhythm Consultations Obtained:  N/A Problem List / ED Course / Critical interventions / Medication management  Right leg pain and swelling Vitals signs significant for blood pressure 182/82.  Otherwise within normal limits. Laboratory/imaging studies significant for: See above On physical examination, patient is afebrile and appears in no acute distress.  She does have tenderness to palpation to her right calf and  the back of her leg.  Mild swelling noted on the right ankle and calf.  No history of trauma, surgery, recent travel or cancer.  Wells score of 1.  Ultrasound of the right leg showed no evidence of deep vein thrombosis.   Low suspicion for PE as patient does not have shortness of breath or tachycardia, O2 sat of 97% on room air. I have reviewed the patients home medicines and have made adjustments as needed Continued outpatient therapy. Follow-up with PCP recommended for reevaluation of symptoms. Treatment plan discussed with patient.  Pt acknowledged understanding  was agreeable to the plan. Social Determinants of Health:  N/A Test / Admission / Dispo - Considered:  Worrisome signs and symptoms were discussed with patient, and patient acknowledged understanding to return to the ED if they noticed these signs and symptoms. Patient was stable upon discharge.    {Document critical care time when appropriate:1} {Document review of labs and clinical decision tools ie heart score, Chads2Vasc2 etc:1}  {Document your independent review of radiology images, and any outside records:1} {Document your discussion with family members, caretakers, and with consultants:1} {Document social determinants of health affecting pt's care:1} {Document your decision making why or why not admission, treatments were needed:1} Final Clinical Impression(s) / ED Diagnoses Final diagnoses:  None    Rx / DC Orders ED Discharge Orders     None

## 2022-09-20 ENCOUNTER — Telehealth: Payer: Self-pay

## 2022-09-20 ENCOUNTER — Non-Acute Institutional Stay: Payer: Medicare PPO | Admitting: Internal Medicine

## 2022-09-20 ENCOUNTER — Encounter: Payer: Self-pay | Admitting: Internal Medicine

## 2022-09-20 VITALS — BP 132/90 | HR 67 | Temp 97.7°F | Resp 18 | Ht 65.0 in | Wt 137.0 lb

## 2022-09-20 DIAGNOSIS — I83891 Varicose veins of right lower extremities with other complications: Secondary | ICD-10-CM

## 2022-09-20 DIAGNOSIS — M79604 Pain in right leg: Secondary | ICD-10-CM

## 2022-09-20 DIAGNOSIS — I480 Paroxysmal atrial fibrillation: Secondary | ICD-10-CM

## 2022-09-20 NOTE — Telephone Encounter (Signed)
Transition Care Management Follow-up Telephone Call Date of discharge and from where: [facility]; 09/16/2022 How have you been since you were released from the hospital? better Any questions or concerns? No  Items Reviewed: Did the pt receive and understand the discharge instructions provided? Yes  Medications obtained and verified? Yes  Other? No  Any new allergies since your discharge? No  Dietary orders reviewed? Yes Do you have support at home? Yes   Home Care and Equipment/Supplies: Were home health services ordered? no If so, what is the name of the agency? Not applicable  Has the agency set up a time to come to the patient's home? not applicable Were any new equipment or medical supplies ordered?  No What is the name of the medical supply agency? N/A Were you able to get the supplies/equipment? no Do you have any questions related to the use of the equipment or supplies? No  Functional Questionnaire: (I = Independent and D = Dependent) ADLs: I  Bathing/Dressing- I  Meal Prep- I  Eating- I  Maintaining continence- I  Transferring/Ambulation- I  Managing Meds- I  Follow up appointments reviewed:  PCP Hospital f/u appt confirmed? Yes  Scheduled to see Virgie Dad, MD  on 09/21/2022 @ [facility] f/u appt confirmed? No  Scheduled to see N/A on n/A @ N/A. Are transportation arrangements needed? No  If their condition worsens, is the pt aware to call PCP or go to the Emergency Dept.? Yes Was the patient provided with contact information for the PCP's office or ED? Yes Was to pt encouraged to call back with questions or concerns? Yes

## 2022-09-21 ENCOUNTER — Encounter: Payer: Medicare PPO | Admitting: Internal Medicine

## 2022-09-21 NOTE — Progress Notes (Signed)
Location: [facility] of Service:  Clinic (12)  Provider:   Code Status:  Goals of Care:     09/20/2022    3:35 PM  Advanced Directives  Does Patient Have a Medical Advance Directive? Yes  Type of Paramedic of [place];Living will;Out of facility DNR (pink MOST or yellow form)  Copy of [place] in Chart? No - copy requested     Chief Complaint  Patient presents with   Transitions Of Care    Patient states she thought she had a ultrasound    HPI: Patient is a 81 y.o. female seen today for an acute visit for Follow up of her Right leg pain and swelling  [place] Worked on Cycling machine 2 weeks ago.  And then noticed some swelling and pain in her right leg and ankle.  Was seen in the ED where she had Dopplers done which were negative for any DVT .  She came for follow-up. Pain is better but she still feels tight in her muscle in the back of her leg also feels that she has swelling in that leg. No Falls Walking with no issues   Patient has h/o  PAF Symptoms of palpitations and light headedness, no recurrence since diagnosis on monitor and admission to [facility] 2017 - Stress test suggestive of attenuation, no ischemia or infarct - Dronderone '400mg'$  PO BID was successful in suppression but resulted in muscle weakness and fatigue EF 55-60% - Mild MR, mild to mod AR   Follows with Cardiology Doing well   Pain in Left Knee Joint after trip to [place] Injected by Ortho. Has arthritis Past Medical History:  Diagnosis Date   Abnormal posture    Aortic incompetence    Basal cell carcinoma    BPPV (benign paroxysmal positional vertigo)    Chest discomfort    Cobalamin deficiency    Fuchs' corneal dystrophy    Gastritis    HTN (hypertension)    Hyperlipidemia    Insomnia    Light headed    Morbid obesity ([place])    Neck pain    Nonrheumatic aortic (valve) insufficiency    Osteopenia     Overactive bladder    Palpitations    Paroxysmal atrial fibrillation (HCC)    Reactive hypoglycemia    Scar of lip    Senile purpura (HCC)    Seroma due to trauma (HCC)    Tendon pain    Venous insufficiency     History reviewed. No pertinent surgical history.  Allergies  Allergen Reactions   Codeine Nausea And Vomiting, Other (See Comments) and Nausea Only    Other reaction(s): GI Intolerance Other reaction(s): GI Intolerance Other reaction(s): GI Intolerance    Penicillins Rash and Other (See Comments)    Other reaction(s): Unknown Other reaction(s): Unknown Other reaction(s): Unknown     Outpatient Encounter Medications as of 09/20/2022  Medication Sig   apixaban (ELIQUIS) 5 MG TABS tablet Take 1 tablet (5 mg total) by mouth 2 (two) times daily.   Calcium-Vitamins C & D (CALCIUM/C/D PO) Take by mouth daily.   CVS VITAMIN C 1000 MG tablet TAKE 1 TABLET BY MOUTH EVERY DAY   flecainide (TAMBOCOR) 50 MG tablet Take 1 tablet (50 mg total) by mouth 2 (two) times daily.   influenza vaccine adjuvanted (FLUAD) 0.5 ML injection Inject into the muscle.   loratadine (CLARITIN) 10 MG tablet Take 1 tablet (10 mg  total) by mouth daily.   metoprolol succinate (TOPROL-XL) 25 MG 24 hr tablet Take 0.5 tablets (12.5 mg total) by mouth daily.   sodium chloride (OCEAN) 0.65 % SOLN nasal spray Place 1 spray into both nostrils as needed for congestion.   [DISCONTINUED] benzonatate (TESSALON) 100 MG capsule Take 1 capsule (100 mg total) by mouth 2 (two) times daily as needed for cough. (Patient not taking: Reported on 09/20/2022)   No facility-administered encounter medications on file as of 09/20/2022.    Review of Systems:  Review of Systems  Constitutional:  Negative for activity change and appetite change.  HENT: Negative.    Respiratory:  Negative for cough and shortness of breath.   Cardiovascular:  Positive for leg swelling.  Gastrointestinal:  Negative for constipation.   Genitourinary: Negative.   Musculoskeletal:  Negative for arthralgias, gait problem and myalgias.  Skin: Negative.   Neurological:  Negative for dizziness and weakness.  Psychiatric/Behavioral:  Negative for confusion, dysphoric mood and sleep disturbance.     Health Maintenance  Topic Date Due   Medicare Annual Wellness (AWV)  Never done   Hepatitis C Screening  Never done   Zoster Vaccines- Shingrix (1 of 2) Never done   COVID-19 Vaccine (4 - Moderna risk series) 11/05/2021   INFLUENZA VACCINE  06/28/2022   TETANUS/TDAP  07/11/2027   Pneumonia Vaccine 98+ Years old  Completed   DEXA SCAN  Completed   HPV VACCINES  Aged Out    Physical Exam: Vitals:   09/20/22 1529  BP: (!) 132/90  Pulse: 67  Resp: 18  Temp: 97.7 F (36.5 C)  TempSrc: Temporal  SpO2: 100%  Weight: 137 lb (62.1 kg)  Height: '5\' 5"'$  (1.651 m)   Body mass index is 22.8 kg/m. Physical Exam Vitals reviewed.  Constitutional:      Appearance: Normal appearance.  HENT:     Head: Normocephalic.     Nose: Nose normal.     Mouth/Throat:     Mouth: Mucous membranes are moist.     Pharynx: Oropharynx is clear.  Eyes:     Pupils: Pupils are equal, round, and reactive to light.  Cardiovascular:     Rate and Rhythm: Normal rate and regular rhythm.     Pulses: Normal pulses.     Heart sounds: Normal heart sounds. No murmur heard. Pulmonary:     Effort: Pulmonary effort is normal.     Breath sounds: Normal breath sounds.  Abdominal:     General: Abdomen is flat. Bowel sounds are normal.     Palpations: Abdomen is soft.  Musculoskeletal:     Cervical back: Neck supple.     Comments: Mild edema in Right leg with Varicose veins  Skin:    General: Skin is warm.  Neurological:     General: No focal deficit present.     Mental Status: She is alert and oriented to person, place, and time.  Psychiatric:        Mood and Affect: Mood normal.        Thought Content: Thought content normal.     Labs  reviewed: Basic Metabolic Panel: Recent Labs    01/13/22 1046 06/02/22 0000 06/21/22 0000 09/16/22 1146  NA 139 143 142 139  K 5.2 5.7* 4.1 3.7  CL 100 104 105 102  CO2 27 29* 29* 29  GLUCOSE 83  --   --  76  BUN '17 19 16 14  '$ CREATININE 0.93 1.0 1.0 0.91  CALCIUM 10.1  10.1 9.5 9.9  TSH 2.250 2.37 2.54  --    Liver Function Tests: Recent Labs    06/02/22 0000  AST 16  ALT 13  ALKPHOS 63  ALBUMIN 4.5   No results for input(s): "LIPASE", "AMYLASE" in the last 8760 hours. No results for input(s): "AMMONIA" in the last 8760 hours. CBC: Recent Labs    01/13/22 1046 06/02/22 0000 09/16/22 1146  WBC 8.6 8.8 8.0  HGB 13.4 14.2 12.8  HCT 40.7 43 38.8  MCV 89  --  89.8  PLT 227 254 188   Lipid Panel: Recent Labs    01/13/22 1046 06/02/22 0000  CHOL 148 153  HDL 91 106*  LDLCALC 43 34  TRIG 68 64  CHOLHDL 1.6  --    No results found for: "HGBA1C"  Procedures since last visit: US Venous Img Lower Unilateral Right  Result Date: 09/16/2022 CLINICAL DATA:  81 year old female with history of right lower extremity pain and swelling. EXAM: RIGHT LOWER EXTREMITY VENOUS DOPPLER ULTRASOUND TECHNIQUE: Gray-scale sonography with graded compression, as well as color Doppler and duplex ultrasound were performed to evaluate the right lower extremity deep venous systems from the level of the common femoral vein and including the common femoral, femoral, profunda femoral, popliteal and calf veins including the posterior tibial, peroneal and gastrocnemius veins when visible. Spectral Doppler was utilized to evaluate flow at rest and with distal augmentation maneuvers in the common femoral, femoral and popliteal veins. The contralateral common femoral vein was also evaluated for comparison. COMPARISON:  None Available. FINDINGS: RIGHT LOWER EXTREMITY Common Femoral Vein: No evidence of thrombus. Normal compressibility, respiratory phasicity and response to augmentation. Central Greater  Saphenous Vein: No evidence of thrombus. Normal compressibility and flow on color Doppler imaging. Central Profunda Femoral Vein: No evidence of thrombus. Normal compressibility and flow on color Doppler imaging. Femoral Vein: No evidence of thrombus. Normal compressibility, respiratory phasicity and response to augmentation. Popliteal Vein: No evidence of thrombus. Normal compressibility, respiratory phasicity and response to augmentation. Calf Veins: No evidence of thrombus. Normal compressibility and flow on color Doppler imaging. Other Findings:  None. LEFT LOWER EXTREMITY Common Femoral Vein: No evidence of thrombus. Normal compressibility, respiratory phasicity and response to augmentation. IMPRESSION: No evidence of right lower extremity deep venous thrombosis. Ruthann Cancer, MD [facility] Radiology Electronically Signed   By: Ruthann Cancer M.D.   On: 09/16/2022 12:18    Assessment/Plan 1. Right leg pain Most likely Muscular due to Exercise Rest the leg No Strenuous exercise Tylenol PRN  Stretching exercise Doppler negative in ED  2. Varicose veins of right leg with edema   3. PAF (paroxysmal atrial fibrillation) ([place]) On Eliquis    Labs/tests ordered:  * No order type specified * Next appt:  12/12/2022

## 2022-09-22 ENCOUNTER — Encounter: Payer: Medicare PPO | Admitting: Nurse Practitioner

## 2022-10-06 ENCOUNTER — Encounter: Payer: Medicare PPO | Admitting: Nurse Practitioner

## 2022-10-25 NOTE — Progress Notes (Unsigned)
Cardiology Office Note   Date:  10/27/2022   ID:  Cathy Simpson, DOB 01-27-1941, MRN [id number]  PCP:  Virgie Dad, MD  Cardiologist:   Dorris Carnes, MD   Patient presents to establish care    History of Present Illness: Cathy Simpson is a 81 y.o. female with a history of PAF, atypical CP, HTN, Venous insufficiency,   She was previously followed by [facility] [place], [place])    The pt was on Multaq in past   Developed weakness Switched to [employer]  Doing well on this   Pt had a Negative Lexiscan Myoview in AUg 2022 (done since she was on flecanide for 5 years)   I saw the pt in Feb 2023    Since seen the pt denies significant palpitations   Breathing is good  Denies CP    Patient has has L shoulder problems    Taken awhile to get better  QUit going to fitness center in March   Pt would like to have breast implants taken out  Current Meds  Medication Sig   apixaban (ELIQUIS) 5 MG TABS tablet Take 1 tablet (5 mg total) by mouth 2 (two) times daily.   Calcium-Vitamins C & D (CALCIUM/C/D PO) Take by mouth daily.   flecainide (TAMBOCOR) 50 MG tablet Take 1 tablet (50 mg total) by mouth 2 (two) times daily.   metoprolol succinate (TOPROL-XL) 25 MG 24 hr tablet Take 0.5 tablets (12.5 mg total) by mouth daily.     Allergies:   Codeine and Penicillins   Past Medical History:  Diagnosis Date   Abnormal posture    Aortic incompetence    Basal cell carcinoma    BPPV (benign paroxysmal positional vertigo)    Chest discomfort    Cobalamin deficiency    Fuchs' corneal dystrophy    Gastritis    HTN (hypertension)    Hyperlipidemia    Insomnia    Light headed    Morbid obesity ([place])    Neck pain    Nonrheumatic aortic (valve) insufficiency    Osteopenia    Overactive bladder    Palpitations    Paroxysmal atrial fibrillation (HCC)    Reactive hypoglycemia    Scar of lip    Senile purpura (HCC)    Seroma due to trauma (HCC)    Tendon pain    Venous  insufficiency     No past surgical history on file.   Social History:  The patient  reports that she has never smoked. She has never used smokeless tobacco. She reports current alcohol use. She reports that she does not use drugs.   Family History:  The patient's family history is not on file.    ROS:  Please see the history of present illness. All other systems are reviewed and  Negative to the above problem except as noted.    PHYSICAL EXAM: VS:  BP 118/72   Pulse 68   Ht '5\' 5"'$  (1.651 m)   Wt 136 lb 3.2 oz (61.8 kg)   SpO2 96%   BMI 22.66 kg/m   GEN: Well nourished, well developed, in no acute distress  HEENT: normal  Neck: no JVD or carotid bruit Cardiac: RRR; no murmurs  No LE edema. Respiratory:  clear to auscultation bilaterally   GI: soft, nontender, nondistended, + BS  No hepatomegaly  MS: no deformity Moving all extremities   Skin: warm and dry, no rash Neuro:  Strength and sensation are intact  Psych: euthymic mood, full affect   EKG:  EKG is ordered today.  SR 68 bpm     Septal MI    Cardiac Testing :   Echo May 2017 ([place])   LVEF 55 to 93^  normal diastolic funciton.  Trace MR   Exercise myoview  June 2017  Small anteiror attenuation   LVEF 50%  Lipid Panel    Component Value Date/Time   CHOL 153 06/02/2022 0000   CHOL 148 01/13/2022 1046   TRIG 64 06/02/2022 0000   HDL 106 (A) 06/02/2022 0000   HDL 91 01/13/2022 1046   CHOLHDL 1.6 01/13/2022 1046   LDLCALC 34 06/02/2022 0000   LDLCALC 43 01/13/2022 1046      Wt Readings from Last 3 Encounters:  10/27/22 136 lb 3.2 oz (61.8 kg)  09/20/22 137 lb (62.1 kg)  09/16/22 132 lb (59.9 kg)      ASSESSMENT AND PLAN:  1  Hx PAF   No recurrence of afib     Keep on current regimen   2  Blood pressure   BP is well controlled   3  Hx CP  Denies    4  Lipids  Excellent  LDL 34  HDL over 100     Get more active    Gave names of Drs Veverly Fells and Tamala Julian for shoulder   F/U in AUg    Current medicines  are reviewed at length with the patient today.  The patient does not have concerns regarding medicines.  Signed, Dorris Carnes, MD  10/27/2022 3:37 PM    [facility] [place], [place], [place]  [address] Phone: [phone]; Fax: [phone]

## 2022-10-27 ENCOUNTER — Ambulatory Visit: Payer: Medicare PPO | Attending: Internal Medicine | Admitting: Internal Medicine

## 2022-10-27 VITALS — BP 118/72 | HR 68 | Ht 65.0 in | Wt 136.2 lb

## 2022-10-27 DIAGNOSIS — I480 Paroxysmal atrial fibrillation: Secondary | ICD-10-CM

## 2022-10-27 NOTE — Patient Instructions (Signed)
Medication Instructions:   *If you need a refill on your cardiac medications before your next appointment, please call your pharmacy*   Lab Work:  If you have labs (blood work) drawn today and your tests are completely normal, you will receive your results only by: [place] (if you have MyChart) OR A paper copy in the mail If you have any lab test that is abnormal or we need to change your treatment, we will call you to review the results.   Testing/Procedures:    Follow-Up: At [facility], you and your health needs are our priority.  As part of our continuing mission to provide you with exceptional heart care, we have created designated Provider Care Teams.  These Care Teams include your primary Cardiologist (physician) and Advanced Practice Providers (APPs -  Physician Assistants and Nurse Practitioners) who all work together to provide you with the care you need, when you need it.  We recommend signing up for the patient portal called "MyChart".  Sign up information is provided on this After Visit Summary.  MyChart is used to connect with patients for Virtual Visits (Telemedicine).  Patients are able to view lab/test results, encounter notes, upcoming appointments, etc.  Non-urgent messages can be sent to your provider as well.   To learn more about what you can do with MyChart, go to [email or web address].    Your next appointment:   9 month(s)  The format for your next appointment:   In Person  Provider:   DR Dorris Carnes    Other Instructions   Important Information About Sugar

## 2022-11-30 DIAGNOSIS — Z961 Presence of intraocular lens: Secondary | ICD-10-CM | POA: Diagnosis not present

## 2022-11-30 DIAGNOSIS — H18522 Epithelial (juvenile) corneal dystrophy, left eye: Secondary | ICD-10-CM | POA: Diagnosis not present

## 2022-11-30 DIAGNOSIS — H18513 Endothelial corneal dystrophy, bilateral: Secondary | ICD-10-CM | POA: Diagnosis not present

## 2022-12-01 ENCOUNTER — Other Ambulatory Visit (HOSPITAL_BASED_OUTPATIENT_CLINIC_OR_DEPARTMENT_OTHER): Payer: Self-pay

## 2022-12-01 MED ORDER — COMIRNATY 30 MCG/0.3ML IM SUSY
PREFILLED_SYRINGE | INTRAMUSCULAR | 0 refills | Status: DC
  Filled 2022-12-01: qty 0.3, 1d supply, fill #0

## 2022-12-12 ENCOUNTER — Encounter: Payer: Medicare PPO | Admitting: Adult Health

## 2022-12-18 ENCOUNTER — Encounter: Payer: Self-pay | Admitting: Adult Health

## 2022-12-18 NOTE — Progress Notes (Deleted)
Location:  [facility]  POS: Clinic  Provider: Royal Hawthorn, ANP  Code Status: *** Goals of Care:     09/20/2022    3:35 PM  Advanced Directives  Does Patient Have a Medical Advance Directive? Yes  Type of Paramedic of [place];Living will;Out of facility DNR (pink MOST or yellow form)  Copy of [place] in Chart? No - copy requested     No chief complaint on file.   HPI: Patient is a 82 y.o. female seen today for medical management of chronic diseases.    PAF: Followed by cardiology on flecainide and eliquis.  EKG SR Feb 2023  Mammogram Dexa scan 05/23/22: T score -1.2 osteopenia. Ordered at [facility] Colonoscopy  Hx of partial thyroidectomy  Hx of osteopenia has been on fosamax and actonel before per [place] note.  Fuch's dystrophy Vit D and B12 deficiency Left knee OA OAB Past Medical History:  Diagnosis Date   Abnormal posture    Aortic incompetence    Basal cell carcinoma    BPPV (benign paroxysmal positional vertigo)    Chest discomfort    Cobalamin deficiency    Fuchs' corneal dystrophy    Gastritis    HTN (hypertension)    Hyperlipidemia    Insomnia    Light headed    Morbid obesity ([place])    Neck pain    Nonrheumatic aortic (valve) insufficiency    Osteopenia    Overactive bladder    Palpitations    Paroxysmal atrial fibrillation (HCC)    Reactive hypoglycemia    Scar of lip    Senile purpura (HCC)    Seroma due to trauma (HCC)    Tendon pain    Venous insufficiency     No past surgical history on file.  Allergies  Allergen Reactions   Codeine Nausea And Vomiting, Other (See Comments) and Nausea Only    Other reaction(s): GI Intolerance Other reaction(s): GI Intolerance Other reaction(s): GI Intolerance    Penicillins Rash and Other (See Comments)    Other reaction(s): Unknown Other reaction(s): Unknown Other reaction(s): Unknown     Outpatient Encounter Medications as of 12/19/2022   Medication Sig   apixaban (ELIQUIS) 5 MG TABS tablet Take 1 tablet (5 mg total) by mouth 2 (two) times daily.   Calcium-Vitamins C & D (CALCIUM/C/D PO) Take by mouth daily.   COVID-19 mRNA vaccine 2023-2024 (COMIRNATY) syringe Inject into the muscle.   [employer] VITAMIN C 1000 MG tablet TAKE 1 TABLET BY MOUTH EVERY DAY   flecainide (TAMBOCOR) 50 MG tablet Take 1 tablet (50 mg total) by mouth 2 (two) times daily.   influenza vaccine adjuvanted (FLUAD) 0.5 ML injection Inject into the muscle.   loratadine (CLARITIN) 10 MG tablet Take 1 tablet (10 mg total) by mouth daily. (Patient not taking: Reported on 10/27/2022)   metoprolol succinate (TOPROL-XL) 25 MG 24 hr tablet Take 0.5 tablets (12.5 mg total) by mouth daily.   sodium chloride (OCEAN) 0.65 % SOLN nasal spray Place 1 spray into both nostrils as needed for congestion. (Patient not taking: Reported on 10/27/2022)   No facility-administered encounter medications on file as of 12/19/2022.    Review of Systems:  Review of Systems  Health Maintenance  Topic Date Due   Medicare Annual Wellness (AWV)  Never done   Zoster Vaccines- Shingrix (1 of 2) Never done   INFLUENZA VACCINE  06/28/2022   COVID-19 Vaccine (4 - 2023-24 season) 07/29/2022   DTaP/Tdap/Td (3 -  Td or Tdap) 07/11/2027   Pneumonia Vaccine 19+ Years old  Completed   DEXA SCAN  Completed   HPV VACCINES  Aged Out    Physical Exam: There were no vitals filed for this visit. There is no height or weight on file to calculate BMI. Physical Exam  Labs reviewed: Basic Metabolic Panel: Recent Labs    01/13/22 1046 06/02/22 0000 06/21/22 0000 09/16/22 1146  NA 139 143 142 139  K 5.2 5.7* 4.1 3.7  CL 100 104 105 102  CO2 27 29* 29* 29  GLUCOSE 83  --   --  76  BUN 17 19 16 14  $ CREATININE 0.93 1.0 1.0 0.91  CALCIUM 10.1 10.1 9.5 9.9  TSH 2.250 2.37 2.54  --    Liver Function Tests: Recent Labs    06/02/22 0000  AST 16  ALT 13  ALKPHOS 63  ALBUMIN 4.5   No results  for input(s): "LIPASE", "AMYLASE" in the last 8760 hours. No results for input(s): "AMMONIA" in the last 8760 hours. CBC: Recent Labs    01/13/22 1046 06/02/22 0000 09/16/22 1146  WBC 8.6 8.8 8.0  HGB 13.4 14.2 12.8  HCT 40.7 43 38.8  MCV 89  --  89.8  PLT 227 254 188   Lipid Panel: Recent Labs    01/13/22 1046 06/02/22 0000  CHOL 148 153  HDL 91 106*  LDLCALC 43 34  TRIG 68 64  CHOLHDL 1.6  --    No results found for: "HGBA1C"  Procedures since last visit: No results found.  Assessment/Plan There are no diagnoses linked to this encounter.   Labs/tests ordered:  * No order type specified * Next appt:  6 months    Total time **:  time greater than 50% of total time spent doing pt counseling and coordination of care

## 2022-12-19 ENCOUNTER — Encounter: Payer: Medicare PPO | Admitting: Adult Health

## 2022-12-29 ENCOUNTER — Other Ambulatory Visit: Payer: Self-pay | Admitting: Internal Medicine

## 2022-12-29 DIAGNOSIS — I480 Paroxysmal atrial fibrillation: Secondary | ICD-10-CM

## 2022-12-29 NOTE — Telephone Encounter (Signed)
Eliquis '5mg'$  refill request received. Patient is 82 years old, weight-61.8kg, Crea-0.91 on 09/16/22, Diagnosis-Afib, and last seen by Dr. Harrington Challenger on 10/27/2022. Dose is appropriate based on dosing criteria. Will send in refill to requested pharmacy.

## 2023-01-05 DIAGNOSIS — Z85828 Personal history of other malignant neoplasm of skin: Secondary | ICD-10-CM | POA: Diagnosis not present

## 2023-01-05 DIAGNOSIS — D6869 Other thrombophilia: Secondary | ICD-10-CM | POA: Diagnosis not present

## 2023-01-05 DIAGNOSIS — Z1283 Encounter for screening for malignant neoplasm of skin: Secondary | ICD-10-CM | POA: Diagnosis not present

## 2023-01-05 DIAGNOSIS — L821 Other seborrheic keratosis: Secondary | ICD-10-CM | POA: Diagnosis not present

## 2023-01-05 DIAGNOSIS — D229 Melanocytic nevi, unspecified: Secondary | ICD-10-CM | POA: Diagnosis not present

## 2023-01-05 DIAGNOSIS — Z86018 Personal history of other benign neoplasm: Secondary | ICD-10-CM | POA: Diagnosis not present

## 2023-01-05 DIAGNOSIS — L820 Inflamed seborrheic keratosis: Secondary | ICD-10-CM | POA: Diagnosis not present

## 2023-01-05 DIAGNOSIS — L578 Other skin changes due to chronic exposure to nonionizing radiation: Secondary | ICD-10-CM | POA: Diagnosis not present

## 2023-01-11 DIAGNOSIS — H18513 Endothelial corneal dystrophy, bilateral: Secondary | ICD-10-CM | POA: Diagnosis not present

## 2023-01-11 DIAGNOSIS — H18523 Epithelial (juvenile) corneal dystrophy, bilateral: Secondary | ICD-10-CM | POA: Diagnosis not present

## 2023-01-11 DIAGNOSIS — Z961 Presence of intraocular lens: Secondary | ICD-10-CM | POA: Diagnosis not present

## 2023-01-17 ENCOUNTER — Other Ambulatory Visit: Payer: Self-pay | Admitting: Internal Medicine

## 2023-01-26 ENCOUNTER — Other Ambulatory Visit: Payer: Self-pay | Admitting: Internal Medicine

## 2023-03-01 DIAGNOSIS — L570 Actinic keratosis: Secondary | ICD-10-CM | POA: Diagnosis not present

## 2023-03-01 DIAGNOSIS — C44319 Basal cell carcinoma of skin of other parts of face: Secondary | ICD-10-CM | POA: Diagnosis not present

## 2023-03-01 DIAGNOSIS — L820 Inflamed seborrheic keratosis: Secondary | ICD-10-CM | POA: Diagnosis not present

## 2023-03-01 DIAGNOSIS — L720 Epidermal cyst: Secondary | ICD-10-CM | POA: Diagnosis not present

## 2023-03-07 ENCOUNTER — Encounter: Payer: Self-pay | Admitting: Internal Medicine

## 2023-03-07 ENCOUNTER — Non-Acute Institutional Stay: Payer: Medicare PPO | Admitting: Internal Medicine

## 2023-03-07 VITALS — BP 118/74 | HR 66 | Temp 97.6°F | Resp 16 | Ht 65.0 in | Wt 137.4 lb

## 2023-03-07 DIAGNOSIS — R0981 Nasal congestion: Secondary | ICD-10-CM | POA: Diagnosis not present

## 2023-03-07 DIAGNOSIS — M25532 Pain in left wrist: Secondary | ICD-10-CM

## 2023-03-07 DIAGNOSIS — E890 Postprocedural hypothyroidism: Secondary | ICD-10-CM | POA: Diagnosis not present

## 2023-03-07 DIAGNOSIS — I480 Paroxysmal atrial fibrillation: Secondary | ICD-10-CM

## 2023-03-07 DIAGNOSIS — E538 Deficiency of other specified B group vitamins: Secondary | ICD-10-CM | POA: Diagnosis not present

## 2023-03-07 NOTE — Progress Notes (Signed)
Location:  [facility]   Place of Service:  Clinic (12)  Provider:   Code Status:  Goals of Care:     03/07/2023    9:02 AM  Advanced Directives  Does Patient Have a Medical Advance Directive? Yes  Type of Estate agentAdvance Directive Healthcare Power of [place];Living will;Out of facility DNR (pink MOST or yellow form)     Chief Complaint  Patient presents with   Medical Management of Chronic Issues    Follow up. Patient has some questions and concerns   Quality Metric Gaps    Discuss the need for AWV    HPI: Patient is a 82 y.o. female seen today for medical management of chronic diseases.   [place]  Patient has h/o  PAF Symptoms of palpitations and light headedness, no recurrence since diagnosis on monitor and [facility]  Follows with Cardiology Doing well Eliquis and Flecainide Acute issue today Nasal Congestion  And Left Wrist Pain No Falls Pain came few weeks ago Not constant She feels like something is there No Pain Today  Is going to have Mohrs surgery in her face for Basal cell cancer   h/o Osteopenia has taken Fosamax and Actonel before H/o Vit D and Vit b12 def H/o s/p Partial Thyroidectomy H/o Corneal Dystropy move to [facility] from [place]    Past Medical History:  Diagnosis Date   Abnormal posture    Aortic incompetence    Basal cell carcinoma    BPPV (benign paroxysmal positional vertigo)    Chest discomfort    Cobalamin deficiency    Fuchs' corneal dystrophy    Gastritis    HTN (hypertension)    Hyperlipidemia    Insomnia    Light headed    Morbid obesity    Neck pain    Nonrheumatic aortic (valve) insufficiency    Osteopenia    Overactive bladder    Palpitations    Paroxysmal atrial fibrillation    Reactive hypoglycemia    Scar of lip    Senile purpura    Seroma due to trauma    Tendon pain    Venous insufficiency     Past Surgical History:  Procedure Laterality Date    AUGMENTATION MAMMAPLASTY     THYROIDECTOMY, PARTIAL      Allergies  Allergen Reactions   Codeine Nausea And Vomiting, Other (See Comments) and Nausea Only    Other reaction(s): GI Intolerance Other reaction(s): GI Intolerance Other reaction(s): GI Intolerance    Penicillins Rash and Other (See Comments)    Other reaction(s): Unknown Other reaction(s): Unknown Other reaction(s): Unknown     Outpatient Encounter Medications as of 03/07/2023  Medication Sig   Calcium-Vitamins C & D (CALCIUM/C/D PO) Take by mouth daily.   COVID-19 mRNA vaccine 2023-2024 (COMIRNATY) syringe Inject into the muscle.   ELIQUIS 5 MG TABS tablet TAKE 1 TABLET BY MOUTH TWICE A DAY   flecainide (TAMBOCOR) 50 MG tablet TAKE 1 TABLET BY MOUTH TWICE A DAY   metoprolol succinate (TOPROL-XL) 25 MG 24 hr tablet TAKE 1/2 TABLET BY MOUTH EVERY DAY   [DISCONTINUED] influenza vaccine adjuvanted (FLUAD) 0.5 ML injection Inject into the muscle.   [DISCONTINUED] CVS VITAMIN C 1000 MG tablet TAKE 1 TABLET BY MOUTH EVERY DAY (Patient not taking: Reported on 03/07/2023)   [DISCONTINUED] loratadine (CLARITIN) 10 MG tablet Take 1 tablet (10 mg total) by mouth daily. (Patient not taking: Reported on 10/27/2022)   [DISCONTINUED] sodium chloride (OCEAN)  0.65 % SOLN nasal spray Place 1 spray into both nostrils as needed for congestion. (Patient not taking: Reported on 10/27/2022)   No facility-administered encounter medications on file as of 03/07/2023.    Review of Systems:  Review of Systems  Constitutional:  Negative for activity change and appetite change.  HENT:  Positive for postnasal drip and rhinorrhea.   Respiratory:  Negative for cough and shortness of breath.   Cardiovascular:  Negative for leg swelling.  Gastrointestinal:  Negative for constipation.  Genitourinary: Negative.   Musculoskeletal:  Negative for arthralgias, gait problem and myalgias.  Skin: Negative.   Neurological:  Negative for dizziness and weakness.   Psychiatric/Behavioral:  Negative for confusion, dysphoric mood and sleep disturbance.     Health Maintenance  Topic Date Due   Medicare Annual Wellness (AWV)  Never done   COVID-19 Vaccine (4 - 2023-24 season) 03/23/2023 (Originally 07/29/2022)   Zoster Vaccines- Shingrix (2 of 2) 06/06/2023 (Originally 07/30/2021)   INFLUENZA VACCINE  06/29/2023   DTaP/Tdap/Td (3 - Td or Tdap) 07/11/2027   Pneumonia Vaccine 65+ Years old  Completed   DEXA SCAN  Completed   HPV VACCINES  Aged Out    Physical Exam: Vitals:   03/07/23 0903  BP: 118/74  Pulse: 66  Resp: 16  Temp: 97.6 F (36.4 C)  TempSrc: Temporal  SpO2: 96%  Weight: 137 lb 6.4 oz (62.3 kg)  Height: 5\' 5"  (1.651 m)   Body mass index is 22.86 kg/m. Physical Exam Vitals reviewed.  Constitutional:      Appearance: Normal appearance.  HENT:     Head: Normocephalic.     Nose: Nose normal.     Mouth/Throat:     Mouth: Mucous membranes are moist.     Pharynx: Oropharynx is clear.  Eyes:     Pupils: Pupils are equal, round, and reactive to light.  Cardiovascular:     Rate and Rhythm: Normal rate and regular rhythm.     Pulses: Normal pulses.     Heart sounds: Normal heart sounds. No murmur heard. Pulmonary:     Effort: Pulmonary effort is normal.     Breath sounds: Normal breath sounds.  Abdominal:     General: Abdomen is flat. Bowel sounds are normal.     Palpations: Abdomen is soft.  Musculoskeletal:        General: No swelling.     Cervical back: Neck supple.     Comments: Wrist exam No Swelling or redness  Skin:    General: Skin is warm.  Neurological:     General: No focal deficit present.     Mental Status: She is alert and oriented to person, place, and time.  Psychiatric:        Mood and Affect: Mood normal.        Thought Content: Thought content normal.     Labs reviewed: Basic Metabolic Panel: Recent Labs    06/02/22 0000 06/21/22 0000 09/16/22 1146  NA 143 142 139  K 5.7* 4.1 3.7  CL 104  105 102  CO2 29* 29* 29  GLUCOSE  --   --  76  BUN 19 16 14   CREATININE 1.0 1.0 0.91  CALCIUM 10.1 9.5 9.9  TSH 2.37 2.54  --    Liver Function Tests: Recent Labs    06/02/22 0000  AST 16  ALT 13  ALKPHOS 63  ALBUMIN 4.5   No results for input(s): "LIPASE", "AMYLASE" in the last 8760 hours. No results for  input(s): "AMMONIA" in the last 8760 hours. CBC: Recent Labs    06/02/22 0000 09/16/22 1146  WBC 8.8 8.0  HGB 14.2 12.8  HCT 43 38.8  MCV  --  89.8  PLT 254 188   Lipid Panel: Recent Labs    06/02/22 0000  CHOL 153  HDL 106*  LDLCALC 34  TRIG 64   No results found for: "HGBA1C"  Procedures since last visit: No results found.  Assessment/Plan 1. Left wrist pain  - DG Wrist Complete Left; Future  2. Nasal congestion Flonase and Claritin  3. PAF (paroxysmal atrial fibrillation) Eliquis and Flecainide and Toprol  4. H/O partial thyroidectomy Repeat TSH  5. h/o Vitamin B 12 deficiency Repeat Levels 6 DEXA in 2023 Osteopenia Calcium and Vit D 7 OAB (overactive bladder) Does not want Meds right now  Labs/tests ordered:  CBC,CMP,HLD,TSH,B 12 Next appt:  Visit date not found

## 2023-03-07 NOTE — Patient Instructions (Signed)
Can do Flonase /Nasocort 1 spray each nostril  at night for 3 weeks

## 2023-03-09 DIAGNOSIS — Z85828 Personal history of other malignant neoplasm of skin: Secondary | ICD-10-CM | POA: Diagnosis not present

## 2023-03-09 DIAGNOSIS — C44319 Basal cell carcinoma of skin of other parts of face: Secondary | ICD-10-CM | POA: Diagnosis not present

## 2023-05-05 ENCOUNTER — Other Ambulatory Visit: Payer: Self-pay | Admitting: Internal Medicine

## 2023-06-22 ENCOUNTER — Other Ambulatory Visit: Payer: Self-pay | Admitting: Internal Medicine

## 2023-06-22 DIAGNOSIS — I480 Paroxysmal atrial fibrillation: Secondary | ICD-10-CM

## 2023-06-22 NOTE — Telephone Encounter (Signed)
Eliquis 5mg  refill request received. Patient is 82 years old, weight-62.3kg, Crea-0.91 on 09/16/22, Diagnosis-Afib, and last seen by Dr. Tenny Craw on 10/27/22. Dose is appropriate based on dosing criteria. Will send in refill to requested pharmacy.

## 2023-06-27 IMAGING — US US EXTREM LOW VENOUS*L*
1 series · 14 of 24 positions shown · non-contrast
Comparison: None Available.

CLINICAL DATA: LEFT lower extremity swelling and soreness. Reported
history of a recent long flight.

EXAM:
LEFT LOWER EXTREMITY VENOUS DOPPLER ULTRASOUND
TECHNIQUE: Gray-scale sonography with compression, as well as color and duplex
ultrasound, were performed to evaluate the deep venous system(s)
from the level of the common femoral vein through the popliteal and
proximal calf veins.

[Series 1: us venous img lower uni left (dvt) · portal-venous · 14 of 32 slices shown]
[im 1/32]
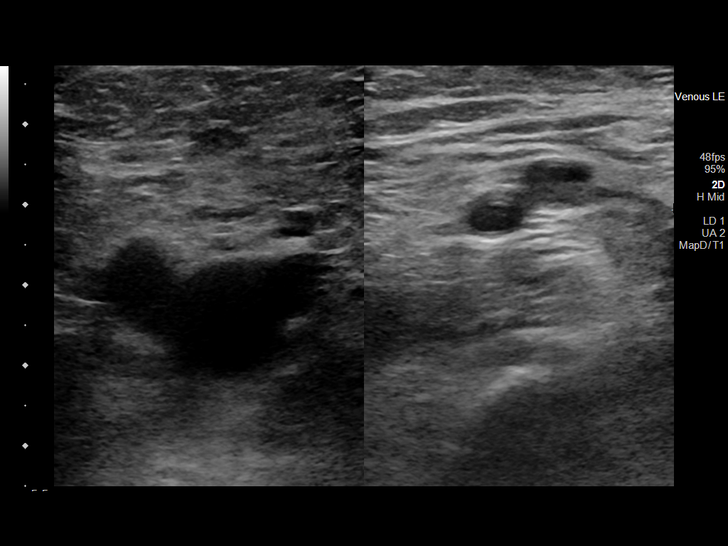
[im 3/32]
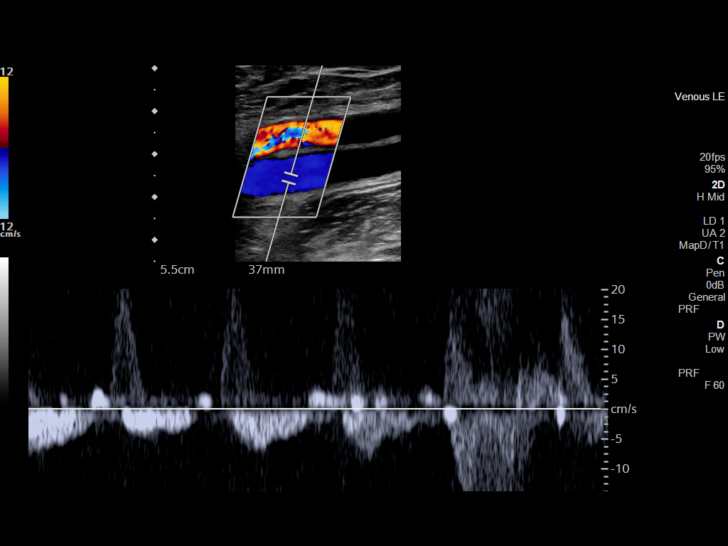
[im 6/32]
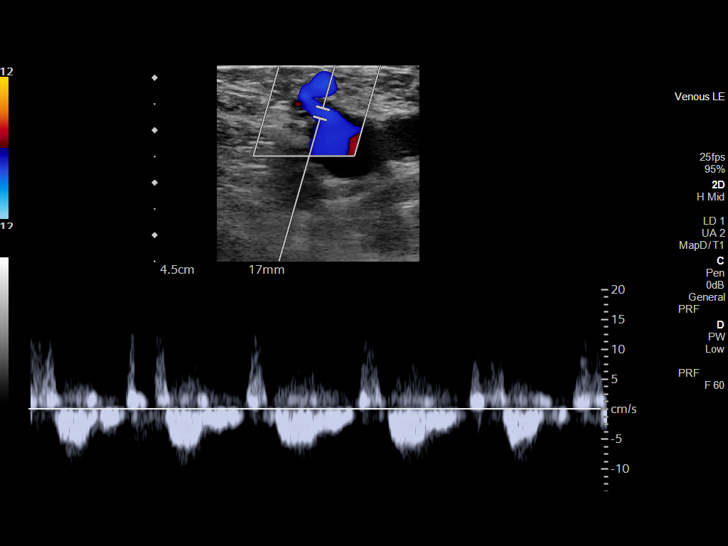
[im 9/32]
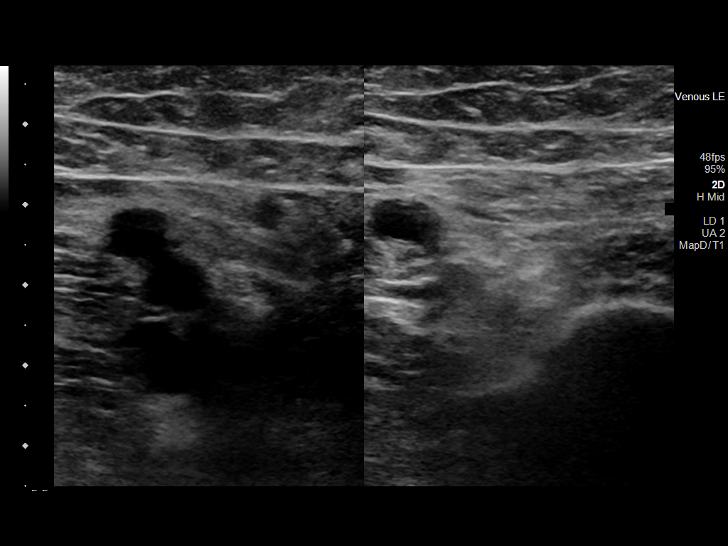
[im 10/32]
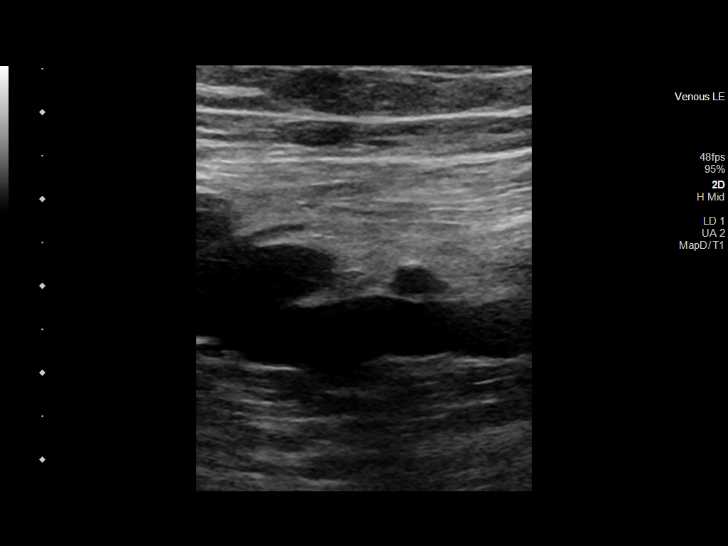
[im 13/32]
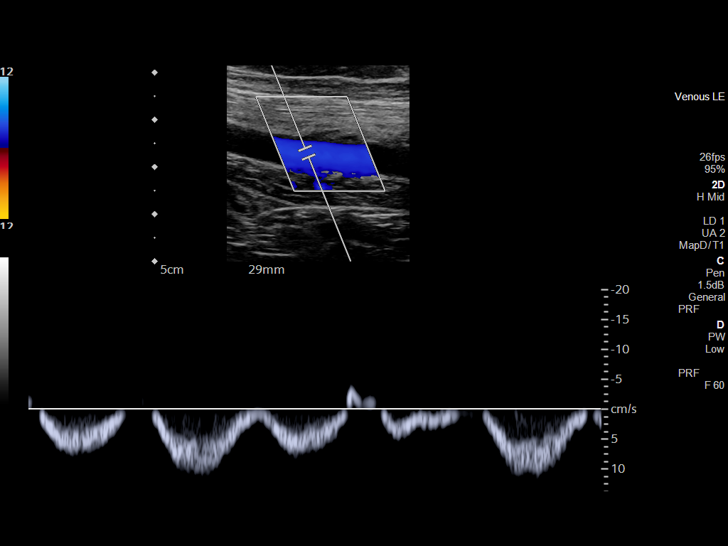
[im 15/32]
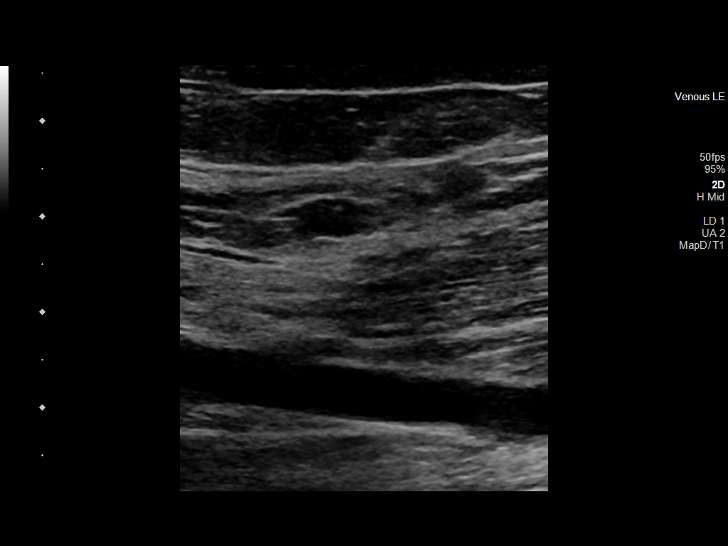
[im 17/32]
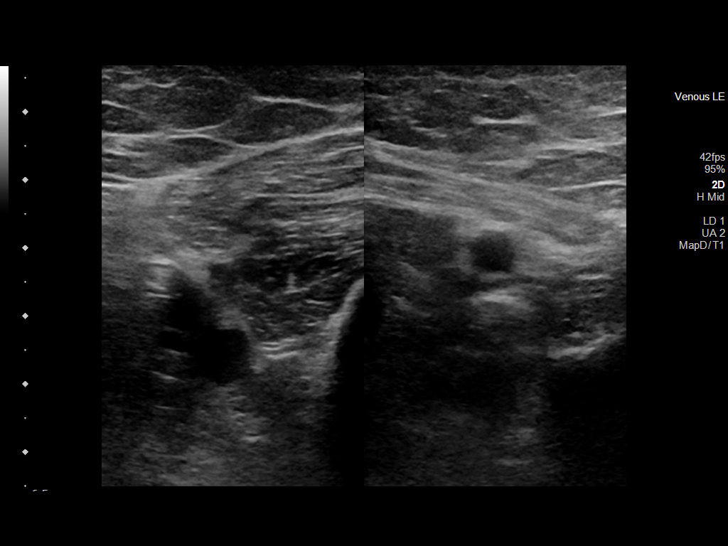
[im 19/32]
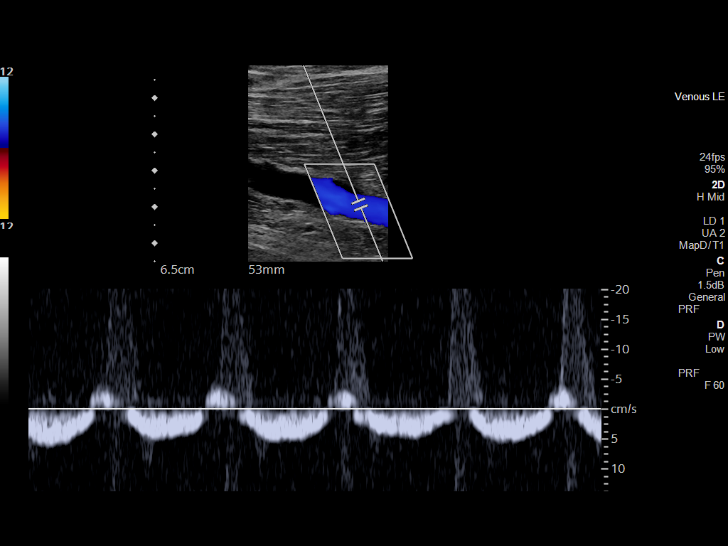
[im 22/32]
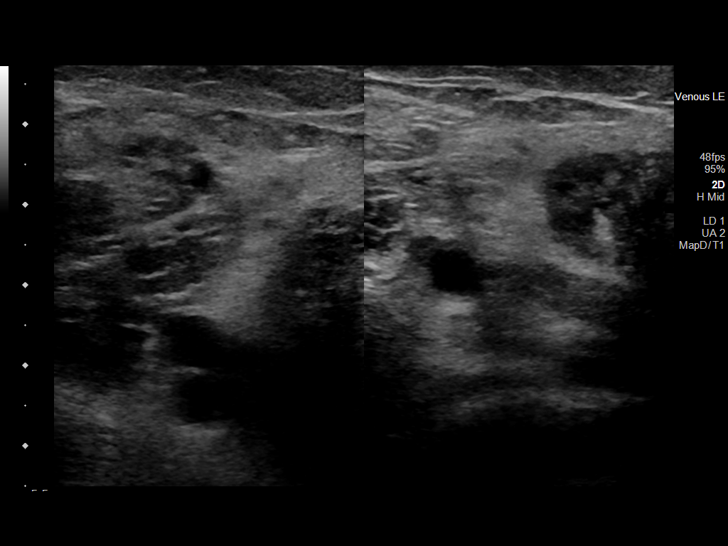
[im 25/32]
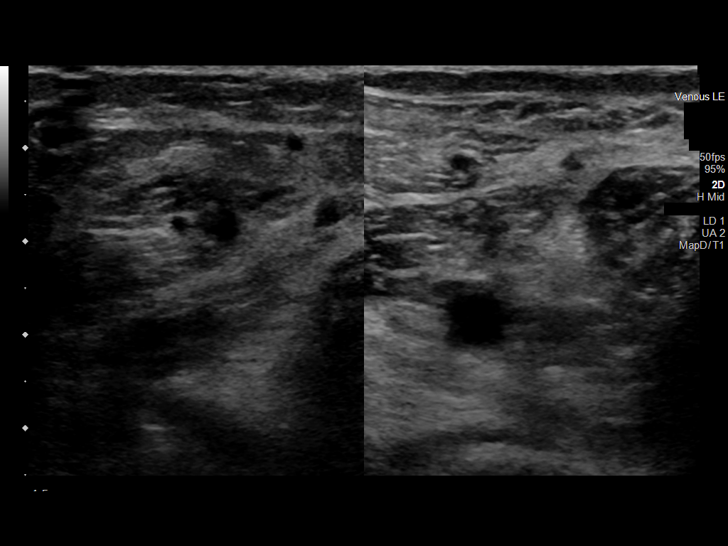
[im 26/32]
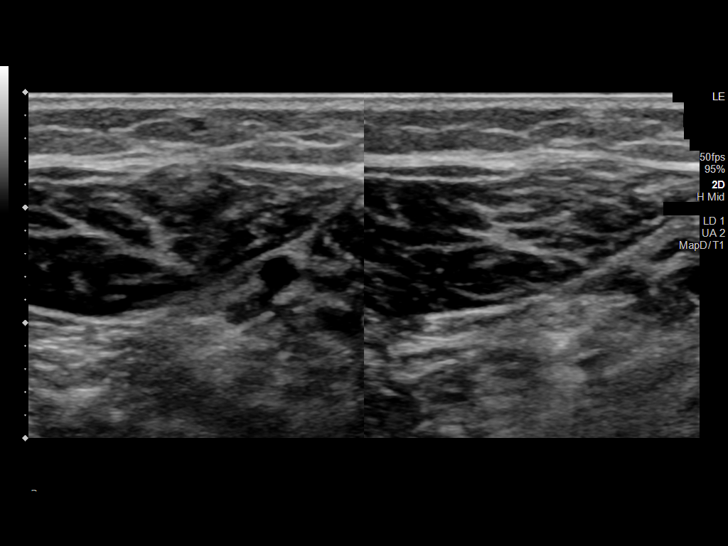
[im 29/32]
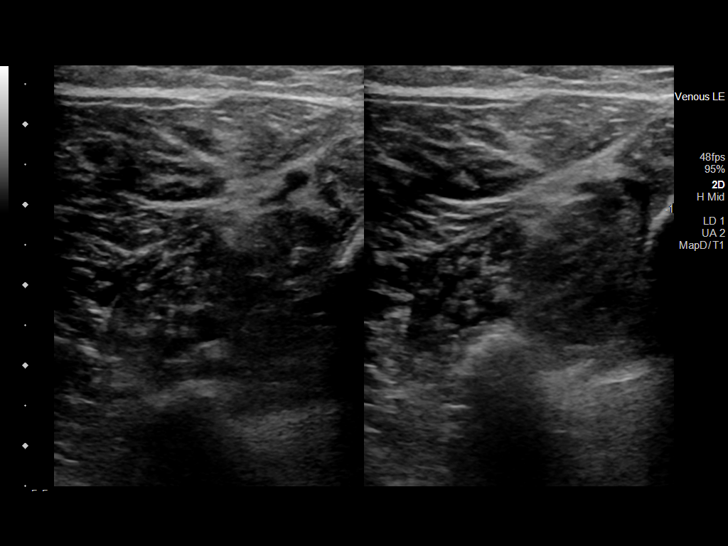
[im 32/32]
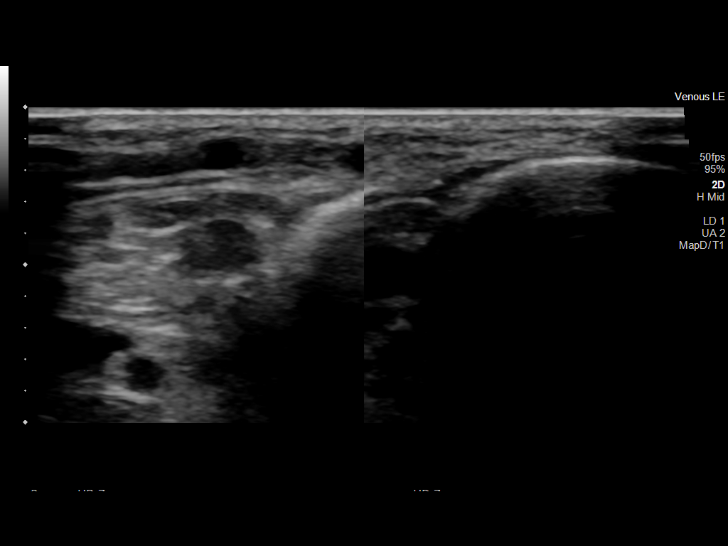

[14 of 24 positions shown; findings below may reference images not displayed]

FINDINGS: VENOUS

Normal compressibility of the LEFT common femoral, superficial
femoral, and popliteal veins, as well as the visualized calf veins.
Visualized portions of profunda femoral vein and great saphenous
vein unremarkable. No filling defects to suggest DVT on grayscale or
color Doppler imaging. Doppler waveforms show normal direction of
venous flow, normal respiratory plasticity and response to
augmentation.

Limited views of the contralateral common femoral vein are
unremarkable.

OTHER

None.

Limitations: none
IMPRESSION: No evidence of femoropopliteal DVT within the LEFT lower extremity.

## 2023-08-04 DIAGNOSIS — L720 Epidermal cyst: Secondary | ICD-10-CM | POA: Diagnosis not present

## 2023-08-04 DIAGNOSIS — Z85828 Personal history of other malignant neoplasm of skin: Secondary | ICD-10-CM | POA: Diagnosis not present

## 2023-08-04 DIAGNOSIS — D224 Melanocytic nevi of scalp and neck: Secondary | ICD-10-CM | POA: Diagnosis not present

## 2023-08-04 DIAGNOSIS — L820 Inflamed seborrheic keratosis: Secondary | ICD-10-CM | POA: Diagnosis not present

## 2023-08-04 DIAGNOSIS — L821 Other seborrheic keratosis: Secondary | ICD-10-CM | POA: Diagnosis not present

## 2023-08-14 DIAGNOSIS — S80861A Insect bite (nonvenomous), right lower leg, initial encounter: Secondary | ICD-10-CM | POA: Diagnosis not present

## 2023-08-14 DIAGNOSIS — L820 Inflamed seborrheic keratosis: Secondary | ICD-10-CM | POA: Diagnosis not present

## 2023-08-14 DIAGNOSIS — Z85828 Personal history of other malignant neoplasm of skin: Secondary | ICD-10-CM | POA: Diagnosis not present

## 2023-09-05 ENCOUNTER — Encounter: Payer: Self-pay | Admitting: Internal Medicine

## 2023-09-05 DIAGNOSIS — E785 Hyperlipidemia, unspecified: Secondary | ICD-10-CM | POA: Diagnosis not present

## 2023-09-05 DIAGNOSIS — D519 Vitamin B12 deficiency anemia, unspecified: Secondary | ICD-10-CM | POA: Diagnosis not present

## 2023-09-05 DIAGNOSIS — I10 Essential (primary) hypertension: Secondary | ICD-10-CM | POA: Diagnosis not present

## 2023-09-05 DIAGNOSIS — E039 Hypothyroidism, unspecified: Secondary | ICD-10-CM | POA: Diagnosis not present

## 2023-09-05 DIAGNOSIS — R300 Dysuria: Secondary | ICD-10-CM | POA: Diagnosis not present

## 2023-09-05 LAB — CBC AND DIFFERENTIAL
HCT: 43 (ref 36–46)
Hemoglobin: 14.2 (ref 12.0–16.0)
Platelets: 229 10*3/uL (ref 150–400)
WBC: 8

## 2023-09-05 LAB — COMPREHENSIVE METABOLIC PANEL
Albumin: 4.8 (ref 3.5–5.0)
Calcium: 9.9 (ref 8.7–10.7)
Globulin: 3.3

## 2023-09-05 LAB — BASIC METABOLIC PANEL
BUN: 13 (ref 4–21)
CO2: 25 — AB (ref 13–22)
Chloride: 100 (ref 99–108)
Creatinine: 1.1 (ref 0.5–1.1)
Glucose: 87
Potassium: 3.8 meq/L (ref 3.5–5.1)
Sodium: 138 (ref 137–147)

## 2023-09-05 LAB — HEPATIC FUNCTION PANEL
ALT: 15 U/L (ref 7–35)
AST: 27 (ref 13–35)
Alkaline Phosphatase: 69 (ref 25–125)
Bilirubin, Total: 1

## 2023-09-05 LAB — VITAMIN B12: Vitamin B-12: 461

## 2023-09-05 LAB — CBC: RBC: 4.64 (ref 3.87–5.11)

## 2023-09-05 LAB — LIPID PANEL
Cholesterol: 149 (ref 0–200)
HDL: 103 — AB (ref 35–70)
LDL Cholesterol: 33
Triglycerides: 65 (ref 40–160)

## 2023-09-05 LAB — TSH: TSH: 2.02 (ref 0.41–5.90)

## 2023-09-06 ENCOUNTER — Telehealth: Payer: Self-pay | Admitting: Internal Medicine

## 2023-09-06 NOTE — Telephone Encounter (Signed)
Patient had UA done for Frequency and Dysuria Culture were negative but she had RBC and Blood in her Urine ? Needs Repeat UA and Follo wup with Urology if Needed On Eliquis also

## 2023-09-11 ENCOUNTER — Encounter: Payer: Self-pay | Admitting: Adult Health

## 2023-09-11 ENCOUNTER — Non-Acute Institutional Stay: Payer: Medicare PPO | Admitting: Adult Health

## 2023-09-11 VITALS — BP 134/82 | HR 64 | Temp 97.9°F | Resp 18 | Ht 65.0 in | Wt 137.2 lb

## 2023-09-11 DIAGNOSIS — R3129 Other microscopic hematuria: Secondary | ICD-10-CM

## 2023-09-11 DIAGNOSIS — R1032 Left lower quadrant pain: Secondary | ICD-10-CM | POA: Diagnosis not present

## 2023-09-11 DIAGNOSIS — E2839 Other primary ovarian failure: Secondary | ICD-10-CM | POA: Diagnosis not present

## 2023-09-11 DIAGNOSIS — E890 Postprocedural hypothyroidism: Secondary | ICD-10-CM | POA: Diagnosis not present

## 2023-09-11 DIAGNOSIS — I480 Paroxysmal atrial fibrillation: Secondary | ICD-10-CM | POA: Diagnosis not present

## 2023-09-11 DIAGNOSIS — I872 Venous insufficiency (chronic) (peripheral): Secondary | ICD-10-CM | POA: Diagnosis not present

## 2023-09-11 MED ORDER — TRIAMCINOLONE ACETONIDE 0.1 % EX CREA
1.0000 | TOPICAL_CREAM | Freq: Two times a day (BID) | CUTANEOUS | 0 refills | Status: AC
Start: 2023-09-11 — End: 2023-09-18

## 2023-09-11 NOTE — Progress Notes (Signed)
Location: [facility]  POS:  clinic  Provider:  Peggye Ley, ANP [facility] [phone]    Goals of Care:     09/11/2023    1:17 PM  Advanced Directives  Does Patient Have a Medical Advance Directive? Yes  Type of Estate agent of [place];Living will;Out of facility DNR (pink MOST or yellow form)  Does patient want to make changes to medical advance directive? No - Patient declined  Copy of Healthcare Power of Attorney in Chart? No - copy requested     Chief Complaint  Patient presents with   Medical Management of Chronic Issues    Patient is being seen for 6 month follow up with labs . Patient states she is seeing flashing lights from a migraine     HPI: Patient is a 82 y.o. female seen today for medical management of chronic diseases.    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 Culture was negative. RBC TNTC and blood 3+ noted in urine. Neg for nitrite, neg leuk esterase, Spec gravity 1.016, WBC 5-10  Had LLQ pain comes and goes worse when she stand up. Has been going on for 6 -8 weeks.  Denies constipation. 30 years post menopausal. Normal stool color and consistency. Regular BMs. No vaginal bleeding. NO rectal bleeding.  No nausea or vomiting or weight loss.   PAF: on Eliquis for CVA prevention and flecainide and toprol for rate control  Prior hx of partial thyroidectomy.   Reports rash to both feet not itching.   2 D ECHO (12/2014) - EF 55-60% - Mild MR, mild to mod AR - Mild TR with PASP 32 mmHg   Bone density 05/23/22 T score -1.2 osteopenia. Has taken fosamax and actonel before.  Mammogram: Needs updated   Colonoscopy: has cologuard notice at home and she will check on it and get back with me.  Past Medical History:  Diagnosis Date   Abnormal posture    Aortic incompetence    Basal cell carcinoma    BPPV (benign  paroxysmal positional vertigo)    Chest discomfort    Cobalamin deficiency    Fuchs' corneal dystrophy    Gastritis    HTN (hypertension)    Hyperlipidemia    Insomnia    Light headed    Morbid obesity (HCC)    Neck pain    Nonrheumatic aortic (valve) insufficiency    Osteopenia    Overactive bladder    Palpitations    Paroxysmal atrial fibrillation (HCC)    Reactive hypoglycemia    Scar of lip    Senile purpura (HCC)    Seroma due to trauma (HCC)    Tendon pain    Venous insufficiency     Past Surgical History:  Procedure Laterality Date   AUGMENTATION MAMMAPLASTY     THYROIDECTOMY, PARTIAL      Allergies  Allergen Reactions   Codeine Nausea And Vomiting, Other (See Comments) and Nausea Only    Other reaction(s): GI Intolerance Other reaction(s): GI Intolerance Other reaction(s): GI Intolerance    Penicillins Rash and Other (See Comments)    Other reaction(s): Unknown Other reaction(s): Unknown Other reaction(s): Unknown     Outpatient Encounter Medications as of 09/11/2023  Medication Sig   Calcium-Vitamins C & D (CALCIUM/C/D PO) Take by mouth daily.   COVID-19 mRNA vaccine 2023-2024 (COMIRNATY) syringe Inject into  the muscle.   ELIQUIS 5 MG TABS tablet TAKE 1 TABLET BY MOUTH TWICE A DAY   flecainide (TAMBOCOR) 50 MG tablet TAKE 1 TABLET BY MOUTH TWICE A DAY   metoprolol succinate (TOPROL-XL) 25 MG 24 hr tablet TAKE 1/2 TABLET BY MOUTH EVERY DAY   triamcinolone cream (KENALOG) 0.1 % Apply 1 Application topically 2 (two) times daily for 7 days.   No facility-administered encounter medications on file as of 09/11/2023.    Review of Systems:  Review of Systems  Constitutional:  Negative for activity change, appetite change, chills, diaphoresis, fatigue, fever and unexpected weight change.  HENT:  Negative for congestion.   Respiratory:  Negative for cough, shortness of breath and wheezing.   Cardiovascular:  Negative for chest pain, palpitations and leg  swelling.  Gastrointestinal:  Positive for abdominal pain. Negative for abdominal distention, constipation and diarrhea.  Genitourinary:  Negative for decreased urine volume, difficulty urinating, dysuria, flank pain, frequency, pelvic pain, urgency, vaginal bleeding, vaginal discharge and vaginal pain.  Musculoskeletal:  Negative for arthralgias, back pain, gait problem, joint swelling and myalgias.  Neurological:  Negative for dizziness, tremors, seizures, syncope, facial asymmetry, speech difficulty, weakness, light-headedness, numbness and headaches.  Psychiatric/Behavioral:  Negative for agitation, behavioral problems and confusion.     Health Maintenance  Topic Date Due   Medicare Annual Wellness (AWV)  Never done   INFLUENZA VACCINE  06/29/2023   COVID-19 Vaccine (4 - 2023-24 season) 07/30/2023   DTaP/Tdap/Td (3 - Td or Tdap) 07/11/2027   Pneumonia Vaccine 73+ Years old  Completed   DEXA SCAN  Completed   Zoster Vaccines- Shingrix  Completed   HPV VACCINES  Aged Out    Physical Exam: Vitals:   09/11/23 1315  BP: 134/82  Pulse: 64  Resp: 18  Temp: 97.9 F (36.6 C)  TempSrc: Temporal  SpO2: 95%  Weight: 137 lb 3.2 oz (62.2 kg)  Height: 5\' 5"  (1.651 m)   Body mass index is 22.83 kg/m. Weight: 137 lb 3.2 oz (62.2 kg)  Wt Readings from Last 3 Encounters:  09/11/23 137 lb 3.2 oz (62.2 kg)  03/07/23 137 lb 6.4 oz (62.3 kg)  10/27/22 136 lb 3.2 oz (61.8 kg)    Physical Exam Vitals and nursing note reviewed.  Constitutional:      General: She is not in acute distress.    Appearance: She is not diaphoretic.  HENT:     Head: Normocephalic and atraumatic.     Right Ear: Tympanic membrane normal.     Left Ear: Tympanic membrane normal.     Nose: Nose normal.     Mouth/Throat:     Mouth: Mucous membranes are moist.     Pharynx: Oropharynx is clear.  Neck:     Vascular: No JVD.  Cardiovascular:     Rate and Rhythm: Normal rate and regular rhythm.     Heart sounds: No  murmur heard. Pulmonary:     Effort: Pulmonary effort is normal. No respiratory distress.     Breath sounds: Normal breath sounds. No wheezing.  Abdominal:     General: Bowel sounds are normal. There is no distension.     Tenderness: There is no abdominal tenderness. There is no right CVA tenderness or left CVA tenderness.  Musculoskeletal:     Right lower leg: No edema.     Left lower leg: No edema.     Comments: Varicose veins.   Skin:    General: Skin is warm and dry.  Findings: Rash (maculopapular scaly rash to both feet and ankles scattered.) present.  Neurological:     Mental Status: She is alert and oriented to person, place, and time.     Labs reviewed: Basic Metabolic Panel: Recent Labs    09/16/22 1146 09/05/23 0000  NA 139 138  K 3.7 3.8  CL 102 100  CO2 29 25*  GLUCOSE 76  --   BUN 14 13  CREATININE 0.91 1.1  CALCIUM 9.9 9.9  TSH  --  2.02   Liver Function Tests: Recent Labs    09/05/23 0000  AST 27  ALT 15  ALKPHOS 69  ALBUMIN 4.8   No results for input(s): "LIPASE", "AMYLASE" in the last 8760 hours. No results for input(s): "AMMONIA" in the last 8760 hours. CBC: Recent Labs    09/16/22 1146 09/05/23 0000  WBC 8.0 8.0  HGB 12.8 14.2  HCT 38.8 43  MCV 89.8  --   PLT 188 229   Lipid Panel: Recent Labs    09/05/23 0000  CHOL 149  HDL 103*  LDLCALC 33  TRIG 65   No results found for: "HGBA1C"  Procedures since last visit: No results found.  Assessment/Plan  1. Microscopic hematuria Will repeat UA C and S If still showing blood recommend urology referral   2. Venous stasis dermatitis  - triamcinolone cream (KENALOG) 0.1 %; Apply 1 Application topically 2 (two) times daily for 7 days.  Dispense: 14 g; Refill: 0  3. Left lower quadrant pain Not present today but comes and goes  No signs of constipation, no weight loss, normal vitals and labs Depending upon UA C and S may need additional imaging, wait on results.   4. PAF  (paroxysmal atrial fibrillation) (HCC) Rate is regular and controlled today on flecainide and metoprolol  On eliquis for CVA risk reduction   5. H/O partial thyroidectomy TSH nl  6. Estrogen deficiency Mammogram ordered.   Labs/tests ordered:  * No order type specified * UA C and S Next appt: F/U 1 month

## 2023-09-11 NOTE — Progress Notes (Unsigned)
Cardiology Office Note   Date:  09/12/2023   ID:  Cathy Simpson, DOB November 22, 1941, MRN [id number]  PCP:  Mahlon Gammon, MD  Cardiologist:   Dietrich Pates, MD   Patient presents to establish care    History of Present Illness: Cathy Simpson is a 82 y.o. female with a history of PAF, atypical CP, HTN, Venous insufficiency,   She was previously followed by [facility] [place], [place])    The pt was on Multaq in past   Developed weakness Switched to [employer]  Doing well on this   Pt had a Negative Lexiscan Myoview in AUg 2022 (done since she was on flecanide for 5 years  I saw the pt in Nov 2023  Since seen she has had brief fluttering   She denies CP  No dizziness  Walks but does do other exercise   Current Meds  Medication Sig   Calcium-Vitamins C & D (CALCIUM/C/D PO) Take by mouth daily.   COVID-19 mRNA vaccine 2023-2024 (COMIRNATY) syringe Inject into the muscle.   ELIQUIS 5 MG TABS tablet TAKE 1 TABLET BY MOUTH TWICE A DAY   flecainide (TAMBOCOR) 50 MG tablet TAKE 1 TABLET BY MOUTH TWICE A DAY   metoprolol succinate (TOPROL-XL) 25 MG 24 hr tablet TAKE 1/2 TABLET BY MOUTH EVERY DAY   triamcinolone cream (KENALOG) 0.1 % Apply 1 Application topically 2 (two) times daily for 7 days.     Allergies:   Codeine and Penicillins   Past Medical History:  Diagnosis Date   Abnormal posture    Aortic incompetence    Basal cell carcinoma    BPPV (benign paroxysmal positional vertigo)    Chest discomfort    Cobalamin deficiency    Fuchs' corneal dystrophy    Gastritis    HTN (hypertension)    Hyperlipidemia    Insomnia    Light headed    Morbid obesity (HCC)    Neck pain    Nonrheumatic aortic (valve) insufficiency    Osteopenia    Overactive bladder    Palpitations    Paroxysmal atrial fibrillation (HCC)    Reactive hypoglycemia    Scar of lip    Senile purpura (HCC)    Seroma due to trauma (HCC)    Tendon pain    Venous insufficiency     Past Surgical  History:  Procedure Laterality Date   AUGMENTATION MAMMAPLASTY     THYROIDECTOMY, PARTIAL       Social History:  The patient  reports that she has never smoked. She has never used smokeless tobacco. She reports current alcohol use. She reports that she does not use drugs.   Family History:  The patient's family history is not on file.    ROS:  Please see the history of present illness. All other systems are reviewed and  Negative to the above problem except as noted.    PHYSICAL EXAM: VS:  BP (!) 156/80 (BP Location: Right Arm, Patient Position: Sitting, Cuff Size: Normal)   Pulse 63   Resp 11   Wt 136 lb 3.2 oz (61.8 kg)   SpO2 98%   BMI 22.66 kg/m   GEN: Well nourished, well developed, in no acute distress  HEENT: normal  Neck: no JVD or carotid bruit Cardiac: RRR; no murmur  NoLE edema. Respiratory:  clear to auscultation bilaterally   GI: soft, nontender, nondistended,  No hepatomegaly     EKG:  EKG is ordered today.  SR 68 bpm  Septal MI    Cardiac Testing :   Echo May 2017 ([facility])   LVEF 55 to 60^  normal diastolic funciton.  Trace MR   Exercise myoview  June 2017  Small anteiror attenuation   LVEF 50%  Lipid Panel    Component Value Date/Time   CHOL 149 09/05/2023 0000   CHOL 148 01/13/2022 1046   TRIG 65 09/05/2023 0000   HDL 103 (A) 09/05/2023 0000   HDL 91 01/13/2022 1046   CHOLHDL 1.6 01/13/2022 1046   LDLCALC 33 09/05/2023 0000   LDLCALC 43 01/13/2022 1046      Wt Readings from Last 3 Encounters:  09/12/23 136 lb 3.2 oz (61.8 kg)  09/11/23 137 lb 3.2 oz (62.2 kg)  03/07/23 137 lb 6.4 oz (62.3 kg)      ASSESSMENT AND PLAN:  1  Hx PAF   No recurrence of afib     Keep on same meds   2  Blood pressure   BP is elevated   On my check 135/   Recomm she get a cuff   Write in with readings in a few wks    Have nurse at [facility] check cuff to make sure accurate   3  Hx CP  Denies    4  Lipids  Excellent  LDL 33  HDL over 103  Increase  activity      Follow up in July 2025   Current medicines are reviewed at length with the patient today.  The patient does not have concerns regarding medicines.  Signed, Dietrich Pates, MD  09/12/2023 11:20 AM    [facility] [address] [place], [place], [place]  [address] Phone: [phone]; Fax: [phone]

## 2023-09-11 NOTE — Patient Instructions (Signed)
Recommend covid and flu vaccine

## 2023-09-12 ENCOUNTER — Ambulatory Visit: Payer: Medicare PPO | Attending: Internal Medicine | Admitting: Internal Medicine

## 2023-09-12 ENCOUNTER — Encounter: Payer: Self-pay | Admitting: Internal Medicine

## 2023-09-12 VITALS — BP 156/80 | HR 63 | Resp 11 | Wt 136.2 lb

## 2023-09-12 DIAGNOSIS — I480 Paroxysmal atrial fibrillation: Secondary | ICD-10-CM | POA: Diagnosis not present

## 2023-09-12 NOTE — Patient Instructions (Signed)
Medication Instructions:   *If you need a refill on your cardiac medications before your next appointment, please call your pharmacy*   Lab Work:  If you have labs (blood work) drawn today and your tests are completely normal, you will receive your results only by: MyChart Message (if you have MyChart) OR A paper copy in the mail If you have any lab test that is abnormal or we need to change your treatment, we will call you to review the results.   Testing/Procedures:    Follow-Up: At [facility], you and your health needs are our priority.  As part of our continuing mission to provide you with exceptional heart care, we have created designated Provider Care Teams.  These Care Teams include your primary Cardiologist (physician) and Advanced Practice Providers (APPs -  Physician Assistants and Nurse Practitioners) who all work together to provide you with the care you need, when you need it.  We recommend signing up for the patient portal called "MyChart".  Sign up information is provided on this After Visit Summary.  MyChart is used to connect with patients for Virtual Visits (Telemedicine).  Patients are able to view lab/test results, encounter notes, upcoming appointments, etc.  Non-urgent messages can be sent to your provider as well.   To learn more about what you can do with MyChart, go to [email or web address].    Your next appointment:  July 2025 with Dr Tenny Craw but check BP at home and send log to DR Tenny Craw via My Chart

## 2023-09-14 DIAGNOSIS — R311 Benign essential microscopic hematuria: Secondary | ICD-10-CM | POA: Diagnosis not present

## 2023-09-15 ENCOUNTER — Other Ambulatory Visit: Payer: Self-pay | Admitting: Adult Health

## 2023-09-15 ENCOUNTER — Telehealth: Payer: Medicare PPO | Admitting: Adult Health

## 2023-09-15 DIAGNOSIS — Z1231 Encounter for screening mammogram for malignant neoplasm of breast: Secondary | ICD-10-CM

## 2023-09-15 DIAGNOSIS — R1032 Left lower quadrant pain: Secondary | ICD-10-CM

## 2023-09-15 NOTE — Telephone Encounter (Signed)
Called and discussed the plan with Cathy Simpson and she verbalized understanding.

## 2023-09-15 NOTE — Telephone Encounter (Signed)
Repeat UA C and S did not show any blood, negative for nitrites and neg for leukocyte esterase as well.   She is still having some intermittent LLQ abd pain present for 6- 8 weeks.  No fever no diarrhea and no abd tenderness.   Discussed with Dr Chales Abrahams, will order CT of the abd.

## 2023-09-20 ENCOUNTER — Other Ambulatory Visit (HOSPITAL_BASED_OUTPATIENT_CLINIC_OR_DEPARTMENT_OTHER): Payer: Self-pay

## 2023-09-20 MED ORDER — FLUAD 0.5 ML IM SUSY
0.5000 mL | PREFILLED_SYRINGE | Freq: Once | INTRAMUSCULAR | 0 refills | Status: AC
  Filled 2023-09-20: qty 0.5, 1d supply, fill #0

## 2023-09-21 ENCOUNTER — Encounter (HOSPITAL_BASED_OUTPATIENT_CLINIC_OR_DEPARTMENT_OTHER): Payer: Self-pay

## 2023-09-21 ENCOUNTER — Ambulatory Visit (HOSPITAL_BASED_OUTPATIENT_CLINIC_OR_DEPARTMENT_OTHER)
Admission: RE | Admit: 2023-09-21 | Discharge: 2023-09-21 | Disposition: A | Discharge status: Home / Self Care | Payer: Medicare PPO | Source: Ambulatory Visit | Attending: Adult Health | Admitting: Adult Health

## 2023-09-21 DIAGNOSIS — R1032 Left lower quadrant pain: Secondary | ICD-10-CM

## 2023-10-07 ENCOUNTER — Ambulatory Visit (HOSPITAL_BASED_OUTPATIENT_CLINIC_OR_DEPARTMENT_OTHER)
Admission: RE | Admit: 2023-10-07 | Discharge: 2023-10-07 | Disposition: A | Discharge status: Home / Self Care | Payer: Medicare PPO | Source: Ambulatory Visit | Attending: Adult Health | Admitting: Adult Health

## 2023-10-07 DIAGNOSIS — N281 Cyst of kidney, acquired: Secondary | ICD-10-CM | POA: Diagnosis not present

## 2023-10-07 DIAGNOSIS — R1032 Left lower quadrant pain: Secondary | ICD-10-CM | POA: Diagnosis not present

## 2023-10-07 DIAGNOSIS — Z9049 Acquired absence of other specified parts of digestive tract: Secondary | ICD-10-CM | POA: Diagnosis not present

## 2023-10-07 MED ORDER — IOHEXOL 300 MG/ML  SOLN
100.0000 mL | Freq: Once | INTRAMUSCULAR | Status: AC | PRN
  Administered 2023-10-07: 100 mL via INTRAVENOUS

## 2023-10-09 ENCOUNTER — Other Ambulatory Visit: Payer: Self-pay | Admitting: Internal Medicine

## 2023-10-10 ENCOUNTER — Encounter: Payer: Self-pay | Admitting: Internal Medicine

## 2023-10-10 ENCOUNTER — Non-Acute Institutional Stay: Payer: Medicare PPO | Admitting: Internal Medicine

## 2023-10-10 VITALS — BP 152/92 | HR 63 | Temp 97.6°F | Resp 16 | Ht 65.0 in | Wt 135.4 lb

## 2023-10-10 DIAGNOSIS — I10 Essential (primary) hypertension: Secondary | ICD-10-CM

## 2023-10-10 DIAGNOSIS — N9489 Other specified conditions associated with female genital organs and menstrual cycle: Secondary | ICD-10-CM

## 2023-10-10 DIAGNOSIS — R1032 Left lower quadrant pain: Secondary | ICD-10-CM

## 2023-10-10 DIAGNOSIS — R3129 Other microscopic hematuria: Secondary | ICD-10-CM | POA: Diagnosis not present

## 2023-10-10 DIAGNOSIS — I480 Paroxysmal atrial fibrillation: Secondary | ICD-10-CM | POA: Diagnosis not present

## 2023-10-10 NOTE — Progress Notes (Signed)
Location: [facility] features editor of Service:  Clinic (12)  Provider:   Code Status:  Goals of Care:     10/10/2023    8:44 AM  Advanced Directives  Does Patient Have a Medical Advance Directive? Yes  Type of Estate agent of [place];Living will;Out of facility DNR (pink MOST or yellow form)  Does patient want to make changes to medical advance directive? No - Patient declined  Copy of Healthcare Power of Attorney in Chart? No - copy requested     Chief Complaint  Patient presents with   Medical Management of Chronic Issues    Patient is here for a follow up and CT scan    HPI: Patient is a 82 y.o. female seen today for an acute visit for Follow up of Her CT scan  [place] in [place]  Had UA for Frequency and Urgency Growth was negative  But had TNTC RBC and Blood Also had LLQ pain for 6 weeks Severe pain one day that she had to call Facility Nurse But it got resolved no Pain for last few weeks CT was done for pian and  hematuria Negative for any Renal Stones. Showed Left Sided Pelvic Congestion syndrome  Patient is now not symptomatic  Past Medical History:  Diagnosis Date   Abnormal posture    Aortic incompetence    Basal cell carcinoma    BPPV (benign paroxysmal positional vertigo)    Chest discomfort    Cobalamin deficiency    Fuchs' corneal dystrophy    Gastritis    HTN (hypertension)    Hyperlipidemia    Insomnia    Light headed    Morbid obesity (HCC)    Neck pain    Nonrheumatic aortic (valve) insufficiency    Osteopenia    Overactive bladder    Palpitations    Paroxysmal atrial fibrillation (HCC)    Reactive hypoglycemia    Scar of lip    Senile purpura (HCC)    Seroma due to trauma (HCC)    Tendon pain    Venous insufficiency     Past Surgical History:  Procedure Laterality Date   AUGMENTATION MAMMAPLASTY     THYROIDECTOMY, PARTIAL      Allergies  Allergen Reactions   Codeine Nausea And  Vomiting, Other (See Comments) and Nausea Only    Other reaction(s): GI Intolerance Other reaction(s): GI Intolerance Other reaction(s): GI Intolerance    Penicillins Rash and Other (See Comments)    Other reaction(s): Unknown Other reaction(s): Unknown Other reaction(s): Unknown     Outpatient Encounter Medications as of 10/10/2023  Medication Sig   Calcium-Vitamins C & D (CALCIUM/C/D PO) Take by mouth daily.   COVID-19 mRNA vaccine 2023-2024 (COMIRNATY) syringe Inject into the muscle.   ELIQUIS 5 MG TABS tablet TAKE 1 TABLET BY MOUTH TWICE A DAY   flecainide (TAMBOCOR) 50 MG tablet TAKE 1 TABLET BY MOUTH TWICE A DAY   metoprolol succinate (TOPROL-XL) 25 MG 24 hr tablet TAKE 1/2 TABLET BY MOUTH DAILY   No facility-administered encounter medications on file as of 10/10/2023.    Review of Systems:  Review of Systems  Constitutional:  Negative for activity change and appetite change.  HENT: Negative.    Respiratory:  Negative for cough and shortness of breath.   Cardiovascular:  Negative for leg swelling.  Gastrointestinal:  Negative for constipation.  Genitourinary: Negative.   Musculoskeletal:  Negative for arthralgias, gait problem and myalgias.  Skin: Negative.  Neurological:  Negative for dizziness and weakness.  Psychiatric/Behavioral:  Negative for confusion, dysphoric mood and sleep disturbance.     Health Maintenance  Topic Date Due   Medicare Annual Wellness (AWV)  Never done   COVID-19 Vaccine (4 - 2023-24 season) 07/30/2023   DTaP/Tdap/Td (3 - Td or Tdap) 07/11/2027   Pneumonia Vaccine 35+ Years old  Completed   INFLUENZA VACCINE  Completed   DEXA SCAN  Completed   Zoster Vaccines- Shingrix  Completed   HPV VACCINES  Aged Out    Physical Exam: Vitals:   10/10/23 0844 10/10/23 0845  BP: (!) 167/97 (!) 152/92  Pulse: 63   Resp: 16   Temp: 97.6 F (36.4 C)   TempSrc: Temporal   SpO2: 97%   Weight: 135 lb 6.4 oz (61.4 kg)   Height: 5\' 5"  (1.651 m)     Body mass index is 22.53 kg/m. Physical Exam Vitals reviewed.  Constitutional:      Appearance: Normal appearance.  HENT:     Head: Normocephalic.     Nose: Nose normal.     Mouth/Throat:     Mouth: Mucous membranes are moist.     Pharynx: Oropharynx is clear.  Eyes:     Pupils: Pupils are equal, round, and reactive to light.  Cardiovascular:     Rate and Rhythm: Normal rate and regular rhythm.     Pulses: Normal pulses.     Heart sounds: Normal heart sounds. No murmur heard. Pulmonary:     Effort: Pulmonary effort is normal.     Breath sounds: Normal breath sounds.  Abdominal:     General: Abdomen is flat. Bowel sounds are normal. There is no distension.     Palpations: Abdomen is soft. There is no mass.     Tenderness: There is no abdominal tenderness. There is no guarding.  Musculoskeletal:        General: No swelling.     Cervical back: Neck supple.  Skin:    General: Skin is warm.  Neurological:     General: No focal deficit present.     Mental Status: She is alert and oriented to person, place, and time.  Psychiatric:        Mood and Affect: Mood normal.        Thought Content: Thought content normal.     Labs reviewed: Basic Metabolic Panel: Recent Labs    09/05/23 0000  NA 138  K 3.8  CL 100  CO2 25*  BUN 13  CREATININE 1.1  CALCIUM 9.9  TSH 2.02   Liver Function Tests: Recent Labs    09/05/23 0000  AST 27  ALT 15  ALKPHOS 69  ALBUMIN 4.8   No results for input(s): "LIPASE", "AMYLASE" in the last 8760 hours. No results for input(s): "AMMONIA" in the last 8760 hours. CBC: Recent Labs    09/05/23 0000  WBC 8.0  HGB 14.2  HCT 43  PLT 229   Lipid Panel: Recent Labs    09/05/23 0000  CHOL 149  HDL 103*  LDLCALC 33  TRIG 65   No results found for: "HGBA1C"  Procedures since last visit: CT ABDOMEN PELVIS W CONTRAST  Result Date: 10/07/2023 CLINICAL DATA:  Left lower quadrant pain EXAM: CT ABDOMEN AND PELVIS WITH CONTRAST  TECHNIQUE: Multidetector CT imaging of the abdomen and pelvis was performed using the standard protocol following bolus administration of intravenous contrast. RADIATION DOSE REDUCTION: This exam was performed according to the departmental dose-optimization program  which includes automated exposure control, adjustment of the mA and/or kV according to patient size and/or use of iterative reconstruction technique. CONTRAST:  OMNIPAQUE IOHEXOL 300 MG/ML  SOLN COMPARISON:  None Available. FINDINGS: Lower chest: There is mild atelectasis in the lung bases. Hepatobiliary: No focal liver abnormality is seen. Status post cholecystectomy. No biliary dilatation. Pancreas: Unremarkable. No pancreatic ductal dilatation or surrounding inflammatory changes. Spleen: Normal in size without focal abnormality. Adrenals/Urinary Tract: There small left-sided peripelvic cysts. Otherwise, the adrenal glands, kidneys and bladder are within normal limits. Stomach/Bowel: Stomach is within normal limits. Appendix appears normal. No evidence of bowel wall thickening, distention, or inflammatory changes. Vascular/Lymphatic: No significant vascular findings are present. No enlarged abdominal or pelvic lymph nodes. Reproductive: Uterus and adnexa are within normal limits. There are prominent left-sided pelvic vessels. Other: No abdominal wall hernia or abnormality. No abdominopelvic ascites. Musculoskeletal: No acute or significant osseous findings. IMPRESSION: 1. No acute localizing process in the abdomen or pelvis. 2. Prominent left-sided pelvic vessels can be seen in the setting of pelvic congestion syndrome. Electronically Signed   By: Darliss Cheney M.D.   On: 10/07/2023 17:08    Assessment/Plan 1. Pelvic congestion syndrome Patient has not had any issues inpast few weeks If pain comes back will refer to Gynecology  2. PAF (paroxysmal atrial fibrillation) (HCC) On Eliquis and Flecainide  3. Left lower quadrant pain Is  resolved Not sure Etiology ? Pelvic Congestion    4. Primary hypertension BP running high Monitor at home showing SBP more then 140 DBP more then 90  I D/W Dr Tenny Craw who suggested to start her on Nrovasc Patient refused Will continue to monitor and will contact Dr Tenny Craw herself   Labs/tests ordered:  UA before Next visit Next appt:  01/16/2024

## 2023-10-12 ENCOUNTER — Ambulatory Visit: Payer: Medicare PPO

## 2023-10-12 ENCOUNTER — Ambulatory Visit
Admission: RE | Admit: 2023-10-12 | Discharge: 2023-10-12 | Disposition: A | Discharge status: Home / Self Care | Payer: Medicare PPO | Source: Ambulatory Visit | Attending: Adult Health | Admitting: Adult Health

## 2023-10-12 DIAGNOSIS — Z1231 Encounter for screening mammogram for malignant neoplasm of breast: Secondary | ICD-10-CM | POA: Diagnosis not present

## 2023-10-17 ENCOUNTER — Encounter: Payer: Medicare PPO | Admitting: Internal Medicine

## 2023-10-30 DIAGNOSIS — Z85828 Personal history of other malignant neoplasm of skin: Secondary | ICD-10-CM | POA: Diagnosis not present

## 2023-10-30 DIAGNOSIS — L820 Inflamed seborrheic keratosis: Secondary | ICD-10-CM | POA: Diagnosis not present

## 2023-10-30 DIAGNOSIS — L720 Epidermal cyst: Secondary | ICD-10-CM | POA: Diagnosis not present

## 2023-11-10 ENCOUNTER — Other Ambulatory Visit: Payer: Self-pay | Admitting: Internal Medicine

## 2024-01-16 ENCOUNTER — Non-Acute Institutional Stay: Payer: Medicare PPO | Admitting: Internal Medicine

## 2024-01-16 VITALS — BP 142/80 | HR 70 | Temp 97.3°F | Resp 20 | Ht 65.0 in | Wt 138.6 lb

## 2024-01-16 DIAGNOSIS — Z1231 Encounter for screening mammogram for malignant neoplasm of breast: Secondary | ICD-10-CM

## 2024-01-16 DIAGNOSIS — I10 Essential (primary) hypertension: Secondary | ICD-10-CM

## 2024-01-16 DIAGNOSIS — E890 Postprocedural hypothyroidism: Secondary | ICD-10-CM | POA: Diagnosis not present

## 2024-01-16 DIAGNOSIS — I480 Paroxysmal atrial fibrillation: Secondary | ICD-10-CM | POA: Diagnosis not present

## 2024-01-16 DIAGNOSIS — E2839 Other primary ovarian failure: Secondary | ICD-10-CM | POA: Diagnosis not present

## 2024-01-16 NOTE — Progress Notes (Unsigned)
 Location:  [facility] features editor of Service:  Clinic (12)  Provider:   Code Status:  Goals of Care:     01/16/2024   10:14 AM  Advanced Directives  Does Patient Have a Medical Advance Directive? Yes  Type of Estate agent of [name];Living will;Out of facility DNR (pink MOST or yellow form)  Does patient want to make changes to medical advance directive? No - Patient declined  Copy of Healthcare Power of Attorney in Chart? No - copy requested     Chief Complaint  Patient presents with   Medical Management of Chronic Issues    1 month follow up and CT. Patient want to discuss both shoulder pain Discuss the need for covid and AWV.    HPI: Patient is a 83 y.o. female seen today for medical management of chronic diseases.   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  History of Present Illness   Cathy Simpson is an 83 year old female who presents for a routine visit following a previous episode of abdominal pain.  She experienced a previous episode of left-sided abdominal pain that was consistent and occurred every time she got out of the car or walked. A CT scan revealed pelvic congestion, but she did not pursue a gynecological evaluation as the pain subsided completely.  She has significant pain in her right shoulder, exacerbated by movement, and notes that both shoulders are affected, with the right being more painful. She is right-handed and attributes some of the  shoulder pain to a previous incident at a fitness center, which led her to stop exercising. She has not seen an orthopedist but plans to attend a session at '[employer]' for exercise. She has not been engaging in regular exercise, which she believes contributes to her shoulder issues.  Her blood pressure at home is similar to the readings in the office, typically around 140/80 to 150/85, and occasionally reaches 150/55. She has not been on amlodipine.  She has not had a COVID shot in the past year due to severe reactions to previous vaccinations, which she describes as 'worse than any COVID' she has experienced. She had COVID in September of the previous year and received a flu shot in September or October.  She is up to date with her mammograms and plans to have a DEXA scan later in the year. She takes calcium and vitamin D supplements daily and engages in some physical activities. Her weight has been stable at 137-138 pounds, which she feels is slightly higher than her ideal weight of 133 pounds.       Past Medical History:  Diagnosis Date   Abnormal posture    Aortic incompetence    Basal cell carcinoma    BPPV (benign paroxysmal positional vertigo)    Chest discomfort    Cobalamin deficiency    Fuchs' corneal dystrophy    Gastritis    HTN (hypertension)    Hyperlipidemia    Insomnia    Light headed  Morbid obesity (HCC)    Neck pain    Nonrheumatic aortic (valve) insufficiency    Osteopenia    Overactive bladder    Palpitations    Paroxysmal atrial fibrillation (HCC)    Reactive hypoglycemia    Scar of lip    Senile purpura (HCC)    Seroma due to trauma (HCC)    Tendon pain    Venous insufficiency     Past Surgical History:  Procedure Laterality Date   AUGMENTATION MAMMAPLASTY     THYROIDECTOMY, PARTIAL      Allergies  Allergen Reactions   Codeine Nausea And Vomiting, Other (See Comments) and Nausea Only    Other reaction(s): GI Intolerance Other reaction(s):  GI Intolerance Other reaction(s): GI Intolerance    Penicillins Rash and Other (See Comments)    Other reaction(s): Unknown Other reaction(s): Unknown Other reaction(s): Unknown     Outpatient Encounter Medications as of 01/16/2024  Medication Sig   Calcium-Vitamins C & D (CALCIUM/C/D PO) Take by mouth daily.   ELIQUIS 5 MG TABS tablet TAKE 1 TABLET BY MOUTH TWICE A DAY   flecainide (TAMBOCOR) 50 MG tablet TAKE 1 TABLET BY MOUTH TWICE A DAY   metoprolol succinate (TOPROL-XL) 25 MG 24 hr tablet TAKE 1/2 TABLET BY MOUTH DAILY   COVID-19 mRNA vaccine 2023-2024 (COMIRNATY) syringe Inject into the muscle.   No facility-administered encounter medications on file as of 01/16/2024.    Review of Systems:  Review of Systems  Constitutional:  Negative for activity change and appetite change.  HENT: Negative.    Respiratory:  Negative for cough and shortness of breath.   Cardiovascular:  Negative for leg swelling.  Gastrointestinal:  Negative for constipation.  Genitourinary: Negative.   Musculoskeletal:  Positive for arthralgias. Negative for gait problem and myalgias.  Skin: Negative.   Neurological:  Negative for dizziness and weakness.  Psychiatric/Behavioral:  Negative for confusion, dysphoric mood and sleep disturbance.     Health Maintenance  Topic Date Due   Medicare Annual Wellness (AWV)  Never done   COVID-19 Vaccine (4 - 2024-25 season) 02/01/2024 (Originally 07/30/2023)   DTaP/Tdap/Td (3 - Td or Tdap) 07/11/2027   Pneumonia Vaccine 43+ Years old  Completed   INFLUENZA VACCINE  Completed   DEXA SCAN  Completed   Zoster Vaccines- Shingrix  Completed   HPV VACCINES  Aged Out    Physical Exam: Vitals:   01/16/24 0910  BP: (!) 142/80  Pulse: 70  Resp: 20  Temp: (!) 97.3 F (36.3 C)  TempSrc: Temporal  SpO2: 98%  Weight: 138 lb 9.6 oz (62.9 kg)  Height: 5\' 5"  (1.651 m)   Body mass index is 23.06 kg/m. Physical Exam Vitals reviewed.  Constitutional:       Appearance: Normal appearance.  HENT:     Head: Normocephalic.     Nose: Nose normal.     Mouth/Throat:     Mouth: Mucous membranes are moist.     Pharynx: Oropharynx is clear.  Eyes:     Pupils: Pupils are equal, round, and reactive to light.  Cardiovascular:     Rate and Rhythm: Normal rate and regular rhythm.     Pulses: Normal pulses.     Heart sounds: Normal heart sounds. No murmur heard. Pulmonary:     Effort: Pulmonary effort is normal.     Breath sounds: Normal breath sounds.  Abdominal:     General: Abdomen is flat. Bowel sounds are normal.     Palpations: Abdomen is  soft.  Musculoskeletal:        General: No swelling.     Cervical back: Neck supple.     Comments: Some Crepitus in both Shoulders But no restriction in Movement of her shoulder/ Point tenderness  Skin:    General: Skin is warm.  Neurological:     General: No focal deficit present.     Mental Status: She is alert and oriented to person, place, and time.  Psychiatric:        Mood and Affect: Mood normal.        Thought Content: Thought content normal.     Labs reviewed: Basic Metabolic Panel: Recent Labs    09/05/23 0000  NA 138  K 3.8  CL 100  CO2 25*  BUN 13  CREATININE 1.1  CALCIUM 9.9  TSH 2.02   Liver Function Tests: Recent Labs    09/05/23 0000  AST 27  ALT 15  ALKPHOS 69  ALBUMIN 4.8   No results for input(s): "LIPASE", "AMYLASE" in the last 8760 hours. No results for input(s): "AMMONIA" in the last 8760 hours. CBC: Recent Labs    09/05/23 0000  WBC 8.0  HGB 14.2  HCT 43  PLT 229   Lipid Panel: Recent Labs    09/05/23 0000  CHOL 149  HDL 103*  LDLCALC 33  TRIG 65   No results found for: "HGBA1C"  Procedures since last visit: No results found.  Assessment/Plan Assessment and Plan    Pelvic Congestion Syndrome Previous abdominal pain now resolved. CT scan showed pelvic congestion. Discussed the uncertainty of the diagnosis and the potential need for  gynecology referral if pain returns. -If pain returns, consider gynecology referral.  Shoulder Pain Right shoulder pain, worse with movement. No prior orthopedic consultation for this issue. Plan to attend [employer] exercise class. -Apply Voltaren gel before exercise. -Consider orthopedic consultation if pain persists or worsens.  Hypertension Blood pressure readings variable, occasionally reaching 150/55. Currently not on antihypertensive medication. -Continue monitoring blood pressure at home. -Consider starting amlodipine if readings consistently above 150.  PAF (paroxysmal atrial fibrillation) Eliquis and Flecainide and Toprol Follows with Cardiology  General Health Maintenance -Order bone density scan for November. -Continue calcium and vitamin D supplementation. -Continue regular dental and eye check-ups. -Consider COVID vaccination when new vaccine becomes available. -Schedule mammogram for November.         Labs/tests ordered:  Labs before the visit Next appt:  Visit date not found

## 2024-01-23 ENCOUNTER — Other Ambulatory Visit: Payer: Self-pay | Admitting: Internal Medicine

## 2024-01-23 DIAGNOSIS — I480 Paroxysmal atrial fibrillation: Secondary | ICD-10-CM

## 2024-01-23 NOTE — Telephone Encounter (Signed)
 Prescription refill request for Eliquis received. Indication:afib Last office visit:10/24 Scr:1.1  10/24 Age: 83 Weight:62.9  kg  Prescription refilled

## 2024-02-09 ENCOUNTER — Other Ambulatory Visit: Payer: Self-pay

## 2024-02-09 ENCOUNTER — Emergency Department (HOSPITAL_BASED_OUTPATIENT_CLINIC_OR_DEPARTMENT_OTHER): Admission: EM | Admit: 2024-02-09 | Discharge: 2024-02-09 | Discharge status: Against Medical Advice

## 2024-02-09 DIAGNOSIS — S9032XA Contusion of left foot, initial encounter: Secondary | ICD-10-CM | POA: Diagnosis not present

## 2024-02-09 NOTE — ED Notes (Signed)
 Patient came to triage but then decided did not want to be seen in ER.  Decided to leave Encouraged to stay to get exanimation or to return if changes her mind

## 2024-02-12 ENCOUNTER — Encounter: Payer: Self-pay | Admitting: Adult Health

## 2024-02-12 ENCOUNTER — Ambulatory Visit: Admitting: Adult Health

## 2024-02-12 VITALS — BP 120/80 | HR 65 | Temp 98.0°F | Resp 20 | Ht 65.0 in | Wt 139.4 lb

## 2024-02-12 DIAGNOSIS — M25562 Pain in left knee: Secondary | ICD-10-CM

## 2024-02-12 DIAGNOSIS — S9032XA Contusion of left foot, initial encounter: Secondary | ICD-10-CM | POA: Diagnosis not present

## 2024-02-12 DIAGNOSIS — M25462 Effusion, left knee: Secondary | ICD-10-CM | POA: Diagnosis not present

## 2024-02-12 NOTE — Patient Instructions (Signed)
 Elevated the left lower extremity  Apply ice  Use tylenol for pain  Message me if not improving for prednisone

## 2024-02-12 NOTE — Progress Notes (Signed)
 Location:  [facility]  POS: Clinic  Provider: Fletcher Anon, ANP   Goals of Care:     02/12/2024    2:49 PM  Advanced Directives  Does Patient Have a Medical Advance Directive? Yes  Type of Estate agent of [place];Living will;Out of facility DNR (pink MOST or yellow form)  Does patient want to make changes to medical advance directive? No - Patient declined  Copy of Healthcare Power of Attorney in Chart? No - copy requested     Chief Complaint  Patient presents with   Leg Pain    Patient is being seen for pain in left leg.    HPI: Patient is a 83 y.o. female seen today for left leg pain  Two weeks ago she hit her left lower leg on an object. She had pain and swelling afterward. She saw ortho on 3/14 and they found no acute fracture and diagnosed her with a contusion.  Now she is having left knee pain , mild swelling, and warmth. She is on eliquis. No numbness or tingling able to ambulate. Feels the left knee pain is worse. States she made the apt to be sure she did not have a blood clot.   Past Medical History:  Diagnosis Date   Abnormal posture    Aortic incompetence    Basal cell carcinoma    BPPV (benign paroxysmal positional vertigo)    Chest discomfort    Cobalamin deficiency    Fuchs' corneal dystrophy    Gastritis    HTN (hypertension)    Hyperlipidemia    Insomnia    Light headed    Morbid obesity (HCC)    Neck pain    Nonrheumatic aortic (valve) insufficiency    Osteopenia    Overactive bladder    Palpitations    Paroxysmal atrial fibrillation (HCC)    Reactive hypoglycemia    Scar of lip    Senile purpura (HCC)    Seroma due to trauma (HCC)    Tendon pain    Venous insufficiency     Past Surgical History:  Procedure Laterality Date   AUGMENTATION MAMMAPLASTY     THYROIDECTOMY, PARTIAL      Allergies  Allergen Reactions   Codeine Nausea And Vomiting, Other (See Comments) and Nausea Only    Other reaction(s): GI  Intolerance Other reaction(s): GI Intolerance Other reaction(s): GI Intolerance    Penicillins Rash and Other (See Comments)    Other reaction(s): Unknown Other reaction(s): Unknown Other reaction(s): Unknown     Outpatient Encounter Medications as of 02/12/2024  Medication Sig   Calcium-Vitamins C & D (CALCIUM/C/D PO) Take by mouth daily.   ELIQUIS 5 MG TABS tablet TAKE 1 TABLET BY MOUTH TWICE A DAY   flecainide (TAMBOCOR) 50 MG tablet TAKE 1 TABLET BY MOUTH TWICE A DAY   metoprolol succinate (TOPROL-XL) 25 MG 24 hr tablet TAKE 1/2 TABLET BY MOUTH DAILY   No facility-administered encounter medications on file as of 02/12/2024.    Review of Systems:  Review of Systems  Constitutional:  Positive for activity change. Negative for appetite change, chills, diaphoresis, fatigue, fever and unexpected weight change.  Musculoskeletal:  Positive for arthralgias (left foot and knee) and joint swelling (left foot and knee). Negative for back pain and gait problem.  Neurological:  Negative for numbness.  Psychiatric/Behavioral:  Negative for confusion.     Health Maintenance  Topic Date Due   COVID-19 Vaccine (4 - 2024-25 season) 02/28/2024 (Originally 07/30/2023)  Medicare Annual Wellness (AWV)  02/29/2024 (Originally 1941-02-12)   DTaP/Tdap/Td (3 - Td or Tdap) 07/11/2027   Pneumonia Vaccine 82+ Years old  Completed   INFLUENZA VACCINE  Completed   DEXA SCAN  Completed   Zoster Vaccines- Shingrix  Completed   HPV VACCINES  Aged Out    Physical Exam: Vitals:   02/12/24 1446  BP: 120/80  Pulse: 65  Resp: 20  Temp: 98 F (36.7 C)  SpO2: 98%  Weight: 139 lb 6.4 oz (63.2 kg)  Height: 5\' 5"  (1.651 m)   Body mass index is 23.2 kg/m. Physical Exam Vitals and nursing note reviewed.  Constitutional:      Appearance: Normal appearance.  Cardiovascular:     Pulses:          Dorsalis pedis pulses are 2+ on the right side and 2+ on the left side.  Musculoskeletal:        General:  Swelling (very mild left knee and left foot) and signs of injury present. No tenderness or deformity.     Right lower leg: No swelling, deformity, lacerations or tenderness.     Left lower leg: Swelling present. No deformity, lacerations or tenderness.     Comments: Slight warmth to left knee. No joint laxity. Negative anterior drawer. No effusion. No redness. No deformity.  Strength 5/5 BLE   Skin:    General: Skin is warm and dry.     Findings: Bruising (left foot and ankle) present.  Neurological:     Mental Status: She is alert.     Labs reviewed: Basic Metabolic Panel: Recent Labs    09/05/23 0000  NA 138  K 3.8  CL 100  CO2 25*  BUN 13  CREATININE 1.1  CALCIUM 9.9  TSH 2.02   Liver Function Tests: Recent Labs    09/05/23 0000  AST 27  ALT 15  ALKPHOS 69  ALBUMIN 4.8   No results for input(s): "LIPASE", "AMYLASE" in the last 8760 hours. No results for input(s): "AMMONIA" in the last 8760 hours. CBC: Recent Labs    09/05/23 0000  WBC 8.0  HGB 14.2  HCT 43  PLT 229   Lipid Panel: Recent Labs    09/05/23 0000  CHOL 149  HDL 103*  LDLCALC 33  TRIG 65   No results found for: "HGBA1C"  Procedures since last visit: No results found.  Assessment/Plan  1. Contusion of left foot, initial encounter (Primary) On Eliquis No significant gait issues Normal circulation No calf tenderness Continue ice and elevation  Tylenol for pain  2. Pain and swelling of left knee Mild inflammation, likely due to underlying OA and compensatory ambulation  Recommend ice and elevation Tylenol for pain If worsening can take prednisone. She can my chart message me for the request.     Labs/tests ordered:  * No order type specified *    Total time :  time greater than 50% of total time spent doing pt counseling and coordination of care

## 2024-02-13 DIAGNOSIS — M7989 Other specified soft tissue disorders: Secondary | ICD-10-CM

## 2024-02-14 ENCOUNTER — Ambulatory Visit (HOSPITAL_COMMUNITY)
Admission: RE | Admit: 2024-02-14 | Discharge: 2024-02-14 | Disposition: A | Discharge status: Home / Self Care | Source: Ambulatory Visit | Attending: Internal Medicine | Admitting: Internal Medicine

## 2024-02-14 DIAGNOSIS — M7989 Other specified soft tissue disorders: Secondary | ICD-10-CM | POA: Diagnosis not present

## 2024-02-14 NOTE — Progress Notes (Signed)
 Left lower extremity venous duplex has been completed. Preliminary results can be found in CV Proc through chart review.  Results were given to Fletcher Anon NP.  02/14/24 12:53 PM Olen Cordial RVT

## 2024-02-15 MED ORDER — PREDNISONE 20 MG PO TABS: 20.0000 mg | ORAL_TABLET | Freq: Two times a day (BID) | ORAL | 0 refills | Status: DC

## 2024-02-16 ENCOUNTER — Telehealth: Payer: Self-pay | Admitting: Internal Medicine

## 2024-02-16 ENCOUNTER — Encounter: Payer: Self-pay | Admitting: Internal Medicine

## 2024-02-16 NOTE — Telephone Encounter (Signed)
 See my chart message today

## 2024-02-16 NOTE — Telephone Encounter (Signed)
 Forwarding to Dr. Tenny Craw for her FYI (Pt aware she should follow up w/ primary MD on this matter)

## 2024-02-16 NOTE — Telephone Encounter (Signed)
 Pt is requesting a callback from nurse regarding her wanting to provide updates about some issues that the office is not aware of. Pt didn't want to share details or topic. Please advise

## 2024-02-19 NOTE — Telephone Encounter (Signed)
 Message routed back to PCP Mahlon Gammon, MD

## 2024-02-19 NOTE — Telephone Encounter (Signed)
Message routed to PCP Gupta, Anjali L, MD  

## 2024-02-27 ENCOUNTER — Ambulatory Visit: Admitting: Internal Medicine

## 2024-02-27 ENCOUNTER — Encounter: Payer: Self-pay | Admitting: Internal Medicine

## 2024-02-27 VITALS — BP 128/80 | HR 70 | Temp 98.0°F | Resp 21 | Ht 65.0 in | Wt 137.6 lb

## 2024-02-27 DIAGNOSIS — M25562 Pain in left knee: Secondary | ICD-10-CM | POA: Diagnosis not present

## 2024-02-27 DIAGNOSIS — M25462 Effusion, left knee: Secondary | ICD-10-CM

## 2024-02-27 DIAGNOSIS — I480 Paroxysmal atrial fibrillation: Secondary | ICD-10-CM

## 2024-02-27 DIAGNOSIS — I10 Essential (primary) hypertension: Secondary | ICD-10-CM

## 2024-02-27 DIAGNOSIS — M7989 Other specified soft tissue disorders: Secondary | ICD-10-CM

## 2024-02-27 DIAGNOSIS — S9032XS Contusion of left foot, sequela: Secondary | ICD-10-CM | POA: Diagnosis not present

## 2024-02-27 NOTE — Progress Notes (Unsigned)
 Location:  [facility]   Place of Service:   Clinic  Provider:   Code Status:  Goals of Care:     02/12/2024    2:49 PM  Advanced Directives  Does Patient Have a Medical Advance Directive? Yes  Type of Estate agent of [name];Living will;Out of facility DNR (pink MOST or yellow form)  Does patient want to make changes to medical advance directive? No - Patient declined  Copy of Healthcare Power of Attorney in Chart? No - copy requested     Chief Complaint  Patient presents with   Follow-up    2 week follow up    HPI: Patient is a 83 y.o. female seen today for an acute visit for Left Leg swelling Lives in [place] in [place]   Discussed the use of AI scribe software for clinical note transcription with the patient, who gave verbal consent to proceed.  History of Present Illness   The patient presents with a history of a severe contusion to the lower extremity, which resulted in significant swelling and bruising. This Happened 4 weeks ago  She reports that the injury was so severe that it caused difficulty walking for approximately four weeks.   The patient is on Eliquis, which she believes may have exacerbated the bruising and swelling.   She got  five-day course of Prednisone by Neysa Bonito , which she reports caused a significant increase in her blood pressure.  Patient has since discontinued the Prednisone and reports that her blood pressure has returned to a more normal range.   Her leg is now normal with no swelling or Bruise She still feels pain in herl eft  Knee She had this pain before 2 year ago but got resolved after Steroid Injection       Her Other history Lives in [place] in [place]   Patient has h/o  PAF Symptoms of palpitations and light headedness, no recurrence since diagnosis on monitor and admission to [facility] 2017  Follows with Cardiology Doing well Eliquis and Flecainide  h/o Osteopenia has taken Fosamax and Actonel before H/o Vit D and Vit  b12 def H/o s/p Partial Thyroidectomy H/o Corneal Dystropy move to [facility] from [place]   Past Medical History:  Diagnosis Date   Abnormal posture    Aortic incompetence    Basal cell carcinoma    BPPV (benign paroxysmal positional vertigo)    Chest discomfort    Cobalamin deficiency    Fuchs' corneal dystrophy    Gastritis    HTN (hypertension)    Hyperlipidemia    Insomnia    Light headed    Morbid obesity (HCC)    Neck pain    Nonrheumatic aortic (valve) insufficiency    Osteopenia    Overactive bladder    Palpitations    Paroxysmal atrial fibrillation (HCC)    Reactive hypoglycemia    Scar of lip    Senile purpura (HCC)    Seroma due to trauma (HCC)    Tendon pain    Venous insufficiency     Past Surgical History:  Procedure Laterality Date   AUGMENTATION MAMMAPLASTY     THYROIDECTOMY, PARTIAL      Allergies  Allergen Reactions   Codeine Nausea And Vomiting, Other (See Comments) and Nausea Only    Other reaction(s): GI Intolerance Other reaction(s): GI Intolerance Other reaction(s): GI Intolerance    Penicillins Rash and Other (See Comments)    Other reaction(s): Unknown Other reaction(s): Unknown Other reaction(s): Unknown  Outpatient Encounter Medications as of 02/27/2024  Medication Sig   Calcium-Vitamins C & D (CALCIUM/C/D PO) Take by mouth daily.   ELIQUIS 5 MG TABS tablet TAKE 1 TABLET BY MOUTH TWICE A DAY   flecainide (TAMBOCOR) 50 MG tablet TAKE 1 TABLET BY MOUTH TWICE A DAY   metoprolol succinate (TOPROL-XL) 25 MG 24 hr tablet TAKE 1/2 TABLET BY MOUTH DAILY   predniSONE (DELTASONE) 20 MG tablet Take 1 tablet (20 mg total) by mouth 2 (two) times daily with a meal.   No facility-administered encounter medications on file as of 02/27/2024.    Review of Systems:  Review of Systems  Constitutional:  Negative for activity change and appetite change.  HENT: Negative.    Respiratory:  Negative for cough and shortness of breath.    Cardiovascular:  Negative for leg swelling.  Gastrointestinal:  Negative for constipation.  Genitourinary: Negative.   Musculoskeletal:  Positive for arthralgias. Negative for gait problem and myalgias.  Skin: Negative.   Neurological:  Negative for dizziness and weakness.  Psychiatric/Behavioral:  Negative for confusion, dysphoric mood and sleep disturbance.     Health Maintenance  Topic Date Due   COVID-19 Vaccine (4 - 2024-25 season) 02/28/2024 (Originally 07/30/2023)   Medicare Annual Wellness (AWV)  02/29/2024 (Originally Dec 03, 1940)   INFLUENZA VACCINE  06/28/2024   DTaP/Tdap/Td (3 - Td or Tdap) 07/11/2027   Pneumonia Vaccine 35+ Years old  Completed   DEXA SCAN  Completed   Zoster Vaccines- Shingrix  Completed   HPV VACCINES  Aged Out    Physical Exam: Vitals:   02/27/24 0816  BP: 128/80  Pulse: 70  Resp: (!) 21  Temp: 98 F (36.7 C)  SpO2: 98%  Weight: 137 lb 9.6 oz (62.4 kg)  Height: 5\' 5"  (1.651 m)   Body mass index is 22.9 kg/m. Physical Exam Vitals reviewed.  Constitutional:      Appearance: Normal appearance.  HENT:     Head: Normocephalic.     Nose: Nose normal.     Mouth/Throat:     Mouth: Mucous membranes are moist.     Pharynx: Oropharynx is clear.  Eyes:     Pupils: Pupils are equal, round, and reactive to light.  Cardiovascular:     Rate and Rhythm: Normal rate and regular rhythm.     Pulses: Normal pulses.     Heart sounds: Normal heart sounds. No murmur heard. Pulmonary:     Effort: Pulmonary effort is normal.     Breath sounds: Normal breath sounds.  Abdominal:     General: Abdomen is flat. Bowel sounds are normal.     Palpations: Abdomen is soft.  Musculoskeletal:        General: No swelling.     Cervical back: Neck supple.     Comments: Knee Exam No Swelling or Redness or Tender  Chronic Venous changes in her Leg but overall bruise and swelling resolved  Skin:    General: Skin is warm.  Neurological:     General: No focal  deficit present.     Mental Status: She is alert and oriented to person, place, and time.  Psychiatric:        Mood and Affect: Mood normal.        Thought Content: Thought content normal.     Labs reviewed: Basic Metabolic Panel: Recent Labs    09/05/23 0000  NA 138  K 3.8  CL 100  CO2 25*  BUN 13  CREATININE 1.1  CALCIUM  9.9  TSH 2.02   Liver Function Tests: Recent Labs    09/05/23 0000  AST 27  ALT 15  ALKPHOS 69  ALBUMIN 4.8   No results for input(s): "LIPASE", "AMYLASE" in the last 8760 hours. No results for input(s): "AMMONIA" in the last 8760 hours. CBC: Recent Labs    09/05/23 0000  WBC 8.0  HGB 14.2  HCT 43  PLT 229   Lipid Panel: Recent Labs    09/05/23 0000  CHOL 149  HDL 103*  LDLCALC 33  TRIG 65   No results found for: "HGBA1C"  Procedures since last visit: VAS [place] LOWER EXTREMITY VENOUS (DVT) Result Date: 02/15/2024  Lower Venous DVT Study Patient Name:  Cathy Simpson  Date of Exam:   02/14/2024 Medical Rec #: [id number]      Accession #:    [id number] Date of Birth: January 12, 1941     Patient Gender: F Patient Age:   83 years Exam Location:  [facility] Procedure:      VAS [place] LOWER EXTREMITY VENOUS (DVT) Referring Phys: Fletcher Anon --------------------------------------------------------------------------------  Indications: Swelling.  Risk Factors: None identified. Comparison Study: No prior studies. Performing Technologist: Chanda Busing RVT  Examination Guidelines: A complete evaluation includes B-mode imaging, spectral Doppler, color Doppler, and power Doppler as needed of all accessible portions of each vessel. Bilateral testing is considered an integral part of a complete examination. Limited examinations for reoccurring indications may be performed as noted. The reflux portion of the exam is performed with the patient in reverse Trendelenburg.  +-----+---------------+---------+-----------+----------+--------------+  RIGHTCompressibilityPhasicitySpontaneityPropertiesThrombus Aging +-----+---------------+---------+-----------+----------+--------------+ CFV  Full           Yes      Yes                                 +-----+---------------+---------+-----------+----------+--------------+   +---------+---------------+---------+-----------+----------+--------------+ LEFT     CompressibilityPhasicitySpontaneityPropertiesThrombus Aging +---------+---------------+---------+-----------+----------+--------------+ CFV      Full           Yes      Yes                                 +---------+---------------+---------+-----------+----------+--------------+ SFJ      Full                                                        +---------+---------------+---------+-----------+----------+--------------+ FV Prox  Full                                                        +---------+---------------+---------+-----------+----------+--------------+ FV Mid   Full                                                        +---------+---------------+---------+-----------+----------+--------------+ FV DistalFull                                                        +---------+---------------+---------+-----------+----------+--------------+  PFV      Full                                                        +---------+---------------+---------+-----------+----------+--------------+ POP      Full           Yes      Yes                                 +---------+---------------+---------+-----------+----------+--------------+ PTV      Full                                                        +---------+---------------+---------+-----------+----------+--------------+ PERO     Full                                                        +---------+---------------+---------+-----------+----------+--------------+    Summary: RIGHT: - No evidence of common femoral vein  obstruction.   LEFT: - There is no evidence of deep vein thrombosis in the lower extremity.  - No cystic structure found in the popliteal fossa.  *See table(s) above for measurements and observations. Electronically signed by Coral Else MD on 02/15/2024 at 7:39:33 AM.    Final     Assessment/Plan  Assessment and Plan    Leg contusion with swelling Leg Contusion with swelling extending to the ankle and leg, causing discomfort and difficulty walking.  Dopplers Negative  The condition has persisted for four weeks, with reduced swelling but ongoing discomfort. X-rays confirmed no fractures, and the contusion is likely causing a small blood collection, expected to resolve over time. Now having Some Knee Pain most likely due to Arthritis - Advise continued monitoring of the knee for another 3-4 weeks. - Recommend Tylenol as needed for pain management. - Consider referral to orthopedics for possible injection if symptoms persist.   Hypertension She experienced elevated blood pressure, likely exacerbated by a recent course of prednisone, with readings as high as 170 mmHg. After discontinuing prednisone, her blood pressure decreased to 128 mmHg, with current home readings around 135-138 mmHg, which is acceptable if maintained below 140 mmHg. She is concerned about the possibility of needing antihypertensive medication. - Continue monitoring blood pressure at home. - Discuss blood pressure management with  Dr. Tenny Craw, to determine if antihypertensive medication is necessary.        Labs/tests ordered:  * No order type specified * Next appt:  07/15/2024

## 2024-02-28 DIAGNOSIS — H18523 Epithelial (juvenile) corneal dystrophy, bilateral: Secondary | ICD-10-CM | POA: Diagnosis not present

## 2024-02-28 DIAGNOSIS — H18513 Endothelial corneal dystrophy, bilateral: Secondary | ICD-10-CM | POA: Diagnosis not present

## 2024-02-28 DIAGNOSIS — Z961 Presence of intraocular lens: Secondary | ICD-10-CM | POA: Diagnosis not present

## 2024-03-04 ENCOUNTER — Other Ambulatory Visit: Payer: Self-pay

## 2024-03-04 NOTE — Telephone Encounter (Signed)
 Follow for now   I would hold off on alcohol for now

## 2024-03-04 NOTE — Telephone Encounter (Signed)
 I called to check on the pt... she says that she saw the nurse at [facility] but the nuirse did not tell her anything... she did do an EKG which is being faxed to [place].   Pt says she is feeling much better as the day goes on.. no palps, no SOB or dizziness.   Will send to Dr Tenny Craw:   EKG NSR, Rate 80.

## 2024-03-04 NOTE — Telephone Encounter (Signed)
 I called the pt to go over her My Chart message from this morning... she says last night there was a medical issue at [facility] so her and her neighbor got together to relax and had a glass of wine.... later in the evening after she went to sleep she was woke up ny her apple watch alert for HR 122... she did not feel well... a little light headed so she went back to sleep but this morning her HR was still 133 at 7 am... she felt very tired.   For the last 30 min prior to me calling at 9:15 am... she says her HR came down to 80 and she was feeling much better.   She is not sure of her BP.... she plans to see the [facility] nurse at 10 am this morning to check her BR and HR... she will ask her if her pulse feels regular... I will call her later this morning to see how she is doing.   I will forward to Dr Tenny Craw.

## 2024-04-02 ENCOUNTER — Encounter: Payer: Self-pay | Admitting: Internal Medicine

## 2024-04-02 ENCOUNTER — Ambulatory Visit: Payer: Self-pay

## 2024-04-02 ENCOUNTER — Ambulatory Visit: Admitting: Internal Medicine

## 2024-04-02 VITALS — BP 130/90 | HR 69 | Temp 97.4°F | Resp 20 | Ht 65.0 in | Wt 139.0 lb

## 2024-04-02 DIAGNOSIS — G8929 Other chronic pain: Secondary | ICD-10-CM | POA: Diagnosis not present

## 2024-04-02 DIAGNOSIS — M25511 Pain in right shoulder: Secondary | ICD-10-CM | POA: Diagnosis not present

## 2024-04-02 DIAGNOSIS — M5489 Other dorsalgia: Secondary | ICD-10-CM

## 2024-04-02 MED ORDER — MELOXICAM 15 MG PO TABS: 15.0000 mg | ORAL_TABLET | Freq: Every day | ORAL | 0 refills | Status: AC

## 2024-04-02 NOTE — Telephone Encounter (Signed)
 Chief Complaint: neck pain Symptoms: neck pain Frequency: x 6 days Pertinent Negatives: Patient denies  Disposition: [] ED /[] Urgent Care (no appt availability in office) / [x] Appointment(In office/virtual)/ []  [facility]/ [] Home Care/ [] Refused Recommended Disposition /[] [facility]/ []  Follow-up with PCP Additional Notes: pt states lower neck and upper back pain states that it worse when lifting her arms. Pt states this started after a bus ride about 6 days ago. States that pain is mod-severe and comes and goes.    Copied from CRM [id number]. Topic: Clinical - Red Word Triage >> Apr 02, 2024  9:30 AM Retta Caster wrote: Red Word that prompted transfer to Nurse Triage: Patient  Pain in neck/Back 6 days Worse 1-2 days  Extreme pain for neck and back Reason for Disposition  [1] SEVERE neck pain (e.g., excruciating, unable to do any normal activities) AND [2] not improved after 2 hours of pain medicine  Answer Assessment - Initial Assessment Questions 1. ONSET: "When did the pain begin?"      6 days 2. LOCATION: "Where does it hurt?"      Where neck and back meets 3. PATTERN "Does the pain come and go, or has it been constant since it started?"      Comes and goes 4. SEVERITY: "How bad is the pain?"  (Scale 1-10; or mild, moderate, severe)   - NO PAIN (0): no pain or only slight stiffness    - MILD (1-3): doesn't interfere with normal activities    - MODERATE (4-7): interferes with normal activities or awakens from sleep    - SEVERE (8-10):  excruciating pain, unable to do any normal activities      Mod-severe 5. RADIATION: "Does the pain go anywhere else, shoot into your arms?"     no 6. CORD SYMPTOMS: "Any weakness or numbness of the arms or legs?"     no 7. CAUSE: "What do you think is causing the neck pain?"     Bus ride  Protocols used: Neck Pain or Stiffness-A-AH

## 2024-04-02 NOTE — Patient Instructions (Signed)
 Apply  Voltaren gel  1-2 times a day on that area to help with Pain

## 2024-04-02 NOTE — Telephone Encounter (Signed)
 Noted.

## 2024-04-04 NOTE — Progress Notes (Signed)
 Location:  [facility]   Place of Service:   Clinic  Provider:   Code Status:  Goals of Care:     02/12/2024    2:49 PM  Advanced Directives  Does Patient Have a Medical Advance Directive? Yes  Type of Estate agent of [place];Living will;Out of facility DNR (pink MOST or yellow form)  Does patient want to make changes to medical advance directive? No - Patient declined  Copy of Healthcare Power of Attorney in Chart? No - copy requested     Chief Complaint  Patient presents with   Neck Pain    HPI: Patient is a 83 y.o. female seen today for an acute visit for Neck Pain  Discussed the use of AI scribe software for clinical note transcription with the patient, who gave verbal consent to proceed.  History of Present Illness   Cathy Simpson is an 83 year old female who presents with severe neck pain following a bumpy bus ride.  She experiences severe neck pain located at the back of her neck and scapula area, which worsened significantly after a bumpy bus ride. The pain is described as 'electric' with certain movements and has become debilitating, preventing her from moving or getting out of bed. She uses Tylenol and a heat pad for relief, though the pain continues to worsen.  She also experiences significant right shoulder pain, which has intensified recently. She associates this pain with moving many boxes in the past.  She has experienced two episodes of high heart rate during sleep, with readings of 123 and 133 bpm, but no recent episodes since limiting wine intake. An EKG was normal.  Her medical history includes atrial fibrillation and elevated blood pressure.   Past Medical History:  Diagnosis Date   Abnormal posture    Aortic incompetence    Basal cell carcinoma    BPPV (benign paroxysmal positional vertigo)    Chest discomfort    Cobalamin deficiency    Fuchs' corneal dystrophy    Gastritis    HTN (hypertension)    Hyperlipidemia     Insomnia    Light headed    Morbid obesity (HCC)    Neck pain    Nonrheumatic aortic (valve) insufficiency    Osteopenia    Overactive bladder    Palpitations    Paroxysmal atrial fibrillation (HCC)    Reactive hypoglycemia    Scar of lip    Senile purpura (HCC)    Seroma due to trauma (HCC)    Tendon pain    Venous insufficiency     Past Surgical History:  Procedure Laterality Date   AUGMENTATION MAMMAPLASTY     THYROIDECTOMY, PARTIAL      Allergies  Allergen Reactions   Codeine Nausea And Vomiting, Other (See Comments) and Nausea Only    Other reaction(s): GI Intolerance Other reaction(s): GI Intolerance Other reaction(s): GI Intolerance    Penicillins Rash and Other (See Comments)    Other reaction(s): Unknown Other reaction(s): Unknown Other reaction(s): Unknown     Outpatient Encounter Medications as of 04/02/2024  Medication Sig   Calcium-Vitamins C & D (CALCIUM/C/D PO) Take by mouth daily.   ELIQUIS  5 MG TABS tablet TAKE 1 TABLET BY MOUTH TWICE A DAY   flecainide  (TAMBOCOR ) 50 MG tablet TAKE 1 TABLET BY MOUTH TWICE A DAY   meloxicam (MOBIC) 15 MG tablet Take 1 tablet (15 mg total) by mouth daily for 7 days.   metoprolol  succinate (TOPROL -XL) 25 MG 24 hr  tablet TAKE 1/2 TABLET BY MOUTH DAILY   [DISCONTINUED] predniSONE  (DELTASONE ) 20 MG tablet Take 1 tablet (20 mg total) by mouth 2 (two) times daily with a meal.   No facility-administered encounter medications on file as of 04/02/2024.    Review of Systems:  Review of Systems  Constitutional:  Positive for activity change. Negative for appetite change.  HENT: Negative.    Respiratory:  Negative for cough and shortness of breath.   Cardiovascular:  Negative for leg swelling.  Gastrointestinal:  Negative for constipation.  Genitourinary: Negative.   Musculoskeletal:  Negative for arthralgias, gait problem and myalgias.  Skin: Negative.   Neurological:  Negative for dizziness and weakness.   Psychiatric/Behavioral:  Negative for confusion, dysphoric mood and sleep disturbance.     Health Maintenance  Topic Date Due   COVID-19 Vaccine (4 - 2024-25 season) 04/18/2024 (Originally 07/30/2023)   Medicare Annual Wellness (AWV)  11/22/2024 (Originally 12-01-1940)   INFLUENZA VACCINE  06/28/2024   DTaP/Tdap/Td (3 - Td or Tdap) 07/11/2027   Pneumonia Vaccine 71+ Years old  Completed   DEXA SCAN  Completed   Zoster Vaccines- Shingrix  Completed   HPV VACCINES  Aged Out   Meningococcal B Vaccine  Aged Out    Physical Exam: Vitals:   04/02/24 1013  BP: (!) 130/90  Pulse: 69  Resp: 20  Temp: (!) 97.4 F (36.3 C)  SpO2: 98%  Weight: 139 lb (63 kg)  Height: 5\' 5"  (1.651 m)   Body mass index is 23.13 kg/m. Physical Exam Vitals reviewed.  Constitutional:      Appearance: Normal appearance.  HENT:     Head: Normocephalic.     Nose: Nose normal.     Mouth/Throat:     Mouth: Mucous membranes are moist.     Pharynx: Oropharynx is clear.  Eyes:     Pupils: Pupils are equal, round, and reactive to light.  Cardiovascular:     Rate and Rhythm: Normal rate and regular rhythm.     Pulses: Normal pulses.     Heart sounds: Normal heart sounds. No murmur heard. Pulmonary:     Effort: Pulmonary effort is normal.     Breath sounds: Normal breath sounds.  Abdominal:     General: Abdomen is flat. Bowel sounds are normal.     Palpations: Abdomen is soft.  Musculoskeletal:        General: No swelling.     Cervical back: Neck supple.     Comments: Able to move her Neck but feels stiffness  Pain more in the Left Paraspinal area  And over her left Scapula  Skin:    General: Skin is warm.  Neurological:     General: No focal deficit present.     Mental Status: She is alert and oriented to person, place, and time.  Psychiatric:        Mood and Affect: Mood normal.        Thought Content: Thought content normal.     Labs reviewed: Basic Metabolic Panel: Recent Labs     09/05/23 0000  NA 138  K 3.8  CL 100  CO2 25*  BUN 13  CREATININE 1.1  CALCIUM 9.9  TSH 2.02   Liver Function Tests: Recent Labs    09/05/23 0000  AST 27  ALT 15  ALKPHOS 69  ALBUMIN 4.8   No results for input(s): "LIPASE", "AMYLASE" in the last 8760 hours. No results for input(s): "AMMONIA" in the last 8760 hours. CBC:  Recent Labs    09/05/23 0000  WBC 8.0  HGB 14.2  HCT 43  PLT 229   Lipid Panel: Recent Labs    09/05/23 0000  CHOL 149  HDL 103*  LDLCALC 33  TRIG 65   No results found for: "HGBA1C"  Procedures since last visit: No results found.  Assessment/Plan 1. Left paraspinal back pain (Primary) Due to Bumpy Bus ride No Bruise Meloxicam 15 mg With food for 7 days Use Voltaren 2-3 times a day  2. Chronic right shoulder pain Will let me know if she wants Ortho referral next visit    Labs/tests ordered:  * No order type specified * Next appt:  07/15/2024

## 2024-05-23 ENCOUNTER — Ambulatory Visit (HOSPITAL_BASED_OUTPATIENT_CLINIC_OR_DEPARTMENT_OTHER)
Admission: RE | Admit: 2024-05-23 | Discharge: 2024-05-23 | Disposition: A | Discharge status: Home / Self Care | Payer: Medicare PPO | Source: Ambulatory Visit | Attending: Internal Medicine | Admitting: Internal Medicine

## 2024-05-23 DIAGNOSIS — E2839 Other primary ovarian failure: Secondary | ICD-10-CM | POA: Diagnosis not present

## 2024-05-23 DIAGNOSIS — M85852 Other specified disorders of bone density and structure, left thigh: Secondary | ICD-10-CM | POA: Diagnosis not present

## 2024-05-23 DIAGNOSIS — Z780 Asymptomatic menopausal state: Secondary | ICD-10-CM | POA: Diagnosis not present

## 2024-05-23 DIAGNOSIS — M85851 Other specified disorders of bone density and structure, right thigh: Secondary | ICD-10-CM | POA: Diagnosis not present

## 2024-06-12 ENCOUNTER — Ambulatory Visit: Payer: Self-pay | Admitting: Internal Medicine

## 2024-07-04 ENCOUNTER — Other Ambulatory Visit: Payer: Self-pay | Admitting: Internal Medicine

## 2024-07-15 ENCOUNTER — Non-Acute Institutional Stay: Payer: Medicare PPO | Admitting: Adult Health

## 2024-07-15 ENCOUNTER — Encounter: Payer: Self-pay | Admitting: Adult Health

## 2024-07-15 VITALS — BP 120/84 | HR 65 | Temp 97.3°F | Ht 65.0 in | Wt 138.2 lb

## 2024-07-15 DIAGNOSIS — E890 Postprocedural hypothyroidism: Secondary | ICD-10-CM

## 2024-07-15 DIAGNOSIS — I480 Paroxysmal atrial fibrillation: Secondary | ICD-10-CM | POA: Diagnosis not present

## 2024-07-15 DIAGNOSIS — M8589 Other specified disorders of bone density and structure, multiple sites: Secondary | ICD-10-CM | POA: Insufficient documentation

## 2024-07-15 DIAGNOSIS — G8929 Other chronic pain: Secondary | ICD-10-CM | POA: Diagnosis not present

## 2024-07-15 DIAGNOSIS — M25511 Pain in right shoulder: Secondary | ICD-10-CM | POA: Diagnosis not present

## 2024-07-15 DIAGNOSIS — I10 Essential (primary) hypertension: Secondary | ICD-10-CM | POA: Diagnosis not present

## 2024-07-15 NOTE — Patient Instructions (Addendum)
 Needs CBC BMP TSH Lipid if not drawn at cardiology apt we will draw here.                    Contains text generated by Abridge.                                 Contains text generated by [employer]bridge.

## 2024-07-15 NOTE — Assessment & Plan Note (Signed)
 Controlled.

## 2024-07-15 NOTE — Assessment & Plan Note (Signed)
 Hx of  Monitor tsh

## 2024-07-15 NOTE — Progress Notes (Signed)
 Location:  [facility]  POS: Clinic  Provider: Tawni America, ANP  Goals of Care:     02/12/2024    2:49 PM  Advanced Directives  Does Patient Have a Medical Advance Directive? Yes  Type of Estate agent of [place];Living will;Out of facility DNR (pink MOST or yellow form)  Does patient want to make changes to medical advance directive? No - Patient declined  Copy of Healthcare Power of Attorney in Chart? No - copy requested     Chief Complaint  Patient presents with   Follow-up    6 month follow up Discuss the need for covid    HPI: Here for routine f/u [place] in [place] at [facility]  History of Present Illness Cathy Simpson is an 83 year old female who presents with shoulder pain.  Right Shoulder pain - Persistent pain for approximately one year - Pain worsens with reaching movements, such as reaching into the back seat of a car - Severe pain during reaching actions - No history of specific injury or fall - No prior shoulder surgery - Difficulty with tasks such as getting dressed due to pain - Able to do her hair without trouble  Osteopenia - Diagnosed osteopenia - Takes calcium and vitamin D supplements - Last bone density scan in June 2025 confirmed osteopenia   Prior hx of taking fosamax and actonel Hx of vit d and b12 def Partial thyroidectomy   Afib: no reports of palpitations or sob. Follows with cardiology  Wt Readings from Last 3 Encounters:  07/15/24 138 lb 3.2 oz (62.7 kg)  04/02/24 139 lb (63 kg)  02/27/24 137 lb 9.6 oz (62.4 kg)    Mammogram neg 10/12/23 Past Medical History:  Diagnosis Date   Abnormal posture    Aortic incompetence    Basal cell carcinoma    BPPV (benign paroxysmal positional vertigo)    Chest discomfort    Cobalamin deficiency    Fuchs' corneal dystrophy    Gastritis    HTN (hypertension)    Hyperlipidemia    Insomnia    Light headed    Morbid obesity (HCC)    Neck pain    Nonrheumatic  aortic (valve) insufficiency    Osteopenia    Overactive bladder    Palpitations    Paroxysmal atrial fibrillation (HCC)    Reactive hypoglycemia    Scar of lip    Senile purpura (HCC)    Seroma due to trauma (HCC)    Tendon pain    Venous insufficiency     Past Surgical History:  Procedure Laterality Date   AUGMENTATION MAMMAPLASTY     THYROIDECTOMY, PARTIAL      Allergies  Allergen Reactions   Codeine Nausea And Vomiting, Other (See Comments) and Nausea Only    Other reaction(s): GI Intolerance Other reaction(s): GI Intolerance Other reaction(s): GI Intolerance    Penicillins Rash and Other (See Comments)    Other reaction(s): Unknown Other reaction(s): Unknown Other reaction(s): Unknown     Outpatient Encounter Medications as of 07/15/2024  Medication Sig   Calcium-Vitamins C & D (CALCIUM/C/D PO) Take by mouth daily.   ELIQUIS  5 MG TABS tablet TAKE 1 TABLET BY MOUTH TWICE A DAY   flecainide  (TAMBOCOR ) 50 MG tablet TAKE 1 TABLET BY MOUTH TWICE A DAY   metoprolol  succinate (TOPROL -XL) 25 MG 24 hr tablet TAKE 1/2 TABLET BY MOUTH DAILY   [DISCONTINUED] flecainide  (TAMBOCOR ) 50 MG tablet TAKE 1 TABLET BY MOUTH TWICE A DAY  No facility-administered encounter medications on file as of 07/15/2024.    Review of Systems:  Review of Systems  Constitutional:  Negative for activity change, appetite change, chills, diaphoresis, fatigue and fever.  HENT:  Negative for congestion.   Respiratory:  Negative for cough, shortness of breath and wheezing.   Cardiovascular:  Negative for chest pain and leg swelling.  Gastrointestinal:  Negative for abdominal distention, abdominal pain, constipation, diarrhea, nausea and vomiting.  Genitourinary:  Negative for difficulty urinating, dysuria and urgency.  Musculoskeletal:  Positive for arthralgias (right shoulder pain). Negative for back pain, gait problem, myalgias and neck pain.  Skin:  Negative for rash.  Neurological:  Negative for  dizziness and weakness.  Psychiatric/Behavioral:  Negative for confusion.     Health Maintenance  Topic Date Due   COVID-19 Vaccine (4 - 2024-25 season) 07/30/2023   INFLUENZA VACCINE  08/27/2024 (Originally 06/28/2024)   Medicare Annual Wellness (AWV)  11/22/2024 (Originally Apr 27, 1941)   DTaP/Tdap/Td (3 - Td or Tdap) 07/11/2027   Pneumococcal Vaccine: 50+ Years  Completed   DEXA SCAN  Completed   Zoster Vaccines- Shingrix  Completed   HPV VACCINES  Aged Out   Meningococcal B Vaccine  Aged Out    Physical Exam: Vitals:   07/15/24 1118  BP: 120/84  Pulse: 65  Temp: (!) 97.3 F (36.3 C)  SpO2: 97%  Weight: 138 lb 3.2 oz (62.7 kg)  Height: 5' 5 (1.651 m)   Body mass index is 23 kg/m. Physical Exam Vitals and nursing note reviewed.  Constitutional:      General: She is not in acute distress.    Appearance: Normal appearance. She is not diaphoretic.  HENT:     Head: Normocephalic and atraumatic.     Nose: No congestion.     Mouth/Throat:     Mouth: Mucous membranes are moist.     Pharynx: Oropharynx is clear. No oropharyngeal exudate or posterior oropharyngeal erythema.  Eyes:     Conjunctiva/sclera: Conjunctivae normal.     Pupils: Pupils are equal, round, and reactive to light.  Neck:     Thyroid : No thyromegaly.     Vascular: No carotid bruit or JVD.  Cardiovascular:     Rate and Rhythm: Normal rate and regular rhythm.     Heart sounds: Normal heart sounds. No murmur heard. Pulmonary:     Effort: Pulmonary effort is normal. No respiratory distress.     Breath sounds: Normal breath sounds. No stridor.  Abdominal:     General: Bowel sounds are normal. There is no distension.     Palpations: Abdomen is soft.     Tenderness: There is no abdominal tenderness. There is no right CVA tenderness or left CVA tenderness.  Musculoskeletal:        General: Normal range of motion.     Right shoulder: Crepitus present. No swelling, deformity, effusion, laceration or  tenderness. Normal range of motion. Normal strength. Normal pulse.     Cervical back: No rigidity. No muscular tenderness.     Right lower leg: No edema.     Left lower leg: No edema.     Comments: Neg empty can test  Lymphadenopathy:     Cervical: No cervical adenopathy.  Skin:    General: Skin is warm and dry.  Neurological:     General: No focal deficit present.     Mental Status: She is alert and oriented to person, place, and time. Mental status is at baseline.  Cranial Nerves: No cranial nerve deficit.  Psychiatric:        Mood and Affect: Mood normal.     Labs reviewed: Basic Metabolic Panel: Recent Labs    09/05/23 0000  NA 138  K 3.8  CL 100  CO2 25*  BUN 13  CREATININE 1.1  CALCIUM 9.9  TSH 2.02   Liver Function Tests: Recent Labs    09/05/23 0000  AST 27  ALT 15  ALKPHOS 69  ALBUMIN 4.8   No results for input(s): LIPASE, AMYLASE in the last 8760 hours. No results for input(s): AMMONIA in the last 8760 hours. CBC: Recent Labs    09/05/23 0000  WBC 8.0  HGB 14.2  HCT 43  PLT 229   Lipid Panel: Recent Labs    09/05/23 0000  CHOL 149  HDL 103*  LDLCALC 33  TRIG 65   No results found for: HGBA1C  Procedures since last visit: No results found.  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  Osteopenia Osteopenia confirmed on bone density scan. Managed with calcium and vitamin D supplementation. - Continue calcium and vitamin D supplementation. - Encourage regular exercise. - Repeat bone density scan in two years.   Paroxysmal atrial fibrillation (HCC) Followed by cardiology on Flecainide  On eliquis  for CVA risk reduction Well controlled   HTN  (hypertension) Controlled   H/O partial thyroidectomy Hx of  Monitor tsh   F/U 6 months with Dr Charlanne  Needs CBC BMP TSH Lipid if not drawn at cardiology apt we will draw here.   Total time :  time greater than 50% of total time spent doing pt counseling and coordination of care

## 2024-07-15 NOTE — Assessment & Plan Note (Signed)
 Followed by cardiology on Flecainide  On eliquis  for CVA risk reduction Well controlled

## 2024-07-23 ENCOUNTER — Other Ambulatory Visit: Payer: Self-pay | Admitting: Internal Medicine

## 2024-07-23 DIAGNOSIS — I480 Paroxysmal atrial fibrillation: Secondary | ICD-10-CM

## 2024-07-23 NOTE — Telephone Encounter (Signed)
 Prescription refill request for Eliquis  received. Indication:afib Last office visit:10/24 Scr:1.1  10/24 Age: 83 Weight:62.7  kg  Prescription refilled

## 2024-07-24 NOTE — Progress Notes (Unsigned)
 Cardiology Office Note   Date:  07/26/2024   ID:  Jaidon Erhart, DOB [birth year], MRN [id number]  PCP:  Charlanne Fredia CROME, MD  Cardiologist:   Vina Gull, MD   Pt presents for follow up of PAF, HTN and  atypical CP     History of Present Illness: Cathy Simpson is a 83 y.o. female with a history of PAF, atypical CP, HTN, Venous insufficiency,   She was previously followed by [facility] [place])    The pt was on Multaq in past   Developed weakness Switched to Flecanide  Doing well on this   2022  Lexiscan myoview negative for ischemia (done since she was on flecanide for 5 years  I saw the pt first  in Nov 2023  She was last in clinic in 2024  SInce seen she notes rare brief fluttering    Has some R parasternal CP that can last awhile Not associated with activity    No exacerbating factors      Current Meds  Medication Sig   Calcium-Vitamins C & D (CALCIUM/C/D PO) Take by mouth daily.   ELIQUIS  5 MG TABS tablet TAKE 1 TABLET BY MOUTH TWICE A DAY   flecainide  (TAMBOCOR ) 50 MG tablet TAKE 1 TABLET BY MOUTH TWICE A DAY   metoprolol  succinate (TOPROL -XL) 25 MG 24 hr tablet TAKE 1/2 TABLET BY MOUTH DAILY     Allergies:   Codeine and Penicillins   Past Medical History:  Diagnosis Date   Abnormal posture    Aortic incompetence    Basal cell carcinoma    BPPV (benign paroxysmal positional vertigo)    Chest discomfort    Cobalamin deficiency    Fuchs' corneal dystrophy    Gastritis    HTN (hypertension)    Hyperlipidemia    Insomnia    Light headed    Morbid obesity (HCC)    Neck pain    Nonrheumatic aortic (valve) insufficiency    Osteopenia    Overactive bladder    Palpitations    Paroxysmal atrial fibrillation (HCC)    Reactive hypoglycemia    Scar of lip    Senile purpura (HCC)    Seroma due to trauma (HCC)    Tendon pain    Venous insufficiency     Past Surgical History:  Procedure Laterality Date   AUGMENTATION MAMMAPLASTY      THYROIDECTOMY, PARTIAL       Social History:  The patient  reports that she has never smoked. She has never used smokeless tobacco. She reports current alcohol use. She reports that she does not use drugs.   Family History:  The patient's family history is not on file.    ROS:  Please see the history of present illness. All other systems are reviewed and  Negative to the above problem except as noted.    PHYSICAL EXAM: VS:  BP (!) 130/90   Pulse 68   Ht 5' 5 (1.651 m)   Wt 136 lb 3.2 oz (61.8 kg)   SpO2 95%   BMI 22.66 kg/m    On my check  180s/84   Then 160s/ 84  GEN: Thin 83 yo  in no acute distress  HEENT: normal  Neck: JVP is normal  No bruits  Cardiac: RRR; no murmur   Respiratory:  clear to auscultation bilaterally   GI: soft,  No hepatomegaly   Ext   NO LE edema      EKG:  EKG is ordered today.  SR 68 bpm     Septal MI  Lateral MI   Nonspecific ST changes   Overall unchanged from previous EKG    Cardiac Testing :   Echo May 2017 ([facility])   LVEF 55 to 60^  normal diastolic funciton.  Trace MR   Exercise myoview  June 2017  Small anteiror attenuation   LVEF 50%  Lipid Panel    Component Value Date/Time   CHOL 149 09/05/2023 0000   CHOL 148 01/13/2022 1046   TRIG 65 09/05/2023 0000   HDL 103 (A) 09/05/2023 0000   HDL 91 01/13/2022 1046   CHOLHDL 1.6 01/13/2022 1046   LDLCALC 33 09/05/2023 0000   LDLCALC 43 01/13/2022 1046      Wt Readings from Last 3 Encounters:  07/26/24 136 lb 3.2 oz (61.8 kg)  07/15/24 138 lb 3.2 oz (62.7 kg)  04/02/24 139 lb (63 kg)      ASSESSMENT AND PLAN:  1  Hx PAF    Rare episodes of brief palpitations, not clearly afib   Keep on current regimen   2  Blood pressure   BP is very elevated today   She did not take meds but she is not on much Will add 2.5 amlodipine  to regimen    Bring cuff and log in 4 wks to review   3  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  The patient does not have concerns regarding medicines.  Signed, Vina Gull, MD  07/26/2024 9:06 AM    [facility] [address] [place], [place], [place]  [address] Phone: [phone]; Fax: [phone]

## 2024-07-26 ENCOUNTER — Encounter: Payer: Self-pay | Admitting: Internal Medicine

## 2024-07-26 ENCOUNTER — Ambulatory Visit: Attending: Internal Medicine | Admitting: Internal Medicine

## 2024-07-26 VITALS — BP 130/90 | HR 68 | Ht 65.0 in | Wt 136.2 lb

## 2024-07-26 DIAGNOSIS — E7800 Pure hypercholesterolemia, unspecified: Secondary | ICD-10-CM

## 2024-07-26 DIAGNOSIS — I480 Paroxysmal atrial fibrillation: Secondary | ICD-10-CM

## 2024-07-26 DIAGNOSIS — I10 Essential (primary) hypertension: Secondary | ICD-10-CM | POA: Diagnosis not present

## 2024-07-26 LAB — CBC

## 2024-07-26 MED ORDER — AMLODIPINE BESYLATE 2.5 MG PO TABS: 2.5000 mg | ORAL_TABLET | Freq: Every day | ORAL | 3 refills | Status: AC

## 2024-07-26 NOTE — Patient Instructions (Signed)
 Medication Instructions:  Your physician has recommended you make the following change in your medication:  1.) start amlodipine  2.5 mg - take one tablet daily in the morning  *If you need a refill on your cardiac medications before your next appointment, please call your pharmacy*  Lab Work: Today, go to the first floor lab for blood work:  vit D, NMR, Lip(a), cbc, cmet, A1c, TSH  If you have labs (blood work) drawn today and your tests are completely normal, you will receive your results only by: MyChart Message (if you have MyChart) OR A paper copy in the mail If you have any lab test that is abnormal or we need to change your treatment, we will call you to review the results.  Testing/Procedures: None  Follow-Up: At [facility], you and your health needs are our priority.  As part of our continuing mission to provide you with exceptional heart care, our providers are all part of one team.  This team includes your primary Cardiologist (physician) and Advanced Practice Providers or APPs (Physician Assistants and Nurse Practitioners) who all work together to provide you with the care you need, when you need it.  Your next appointment:   1 month(s)  Provider:   Vina Gull, MD   Other Instructions

## 2024-07-27 ENCOUNTER — Ambulatory Visit: Payer: Self-pay | Admitting: Internal Medicine

## 2024-07-27 LAB — NMR, LIPOPROFILE
Cholesterol, Total: 135 mg/dL (ref 100–199)
HDL Particle Number: 30.1 umol/L — ABNORMAL LOW (ref >=30.5)
HDL-C: 84 mg/dL (ref >39)
LDL Particle Number: <300 nmol/L (ref <1000)
LDL Size: 21.2 nm (ref >20.5)
LDL-C (NIH Calc): 39 mg/dL (ref 0–99)
LP-IR Score: <25 (ref <=45)
Small LDL Particle Number: <90 nmol/L (ref <=527)
Triglycerides: 57 mg/dL (ref 0–149)

## 2024-07-27 LAB — COMPREHENSIVE METABOLIC PANEL WITH GFR
ALT: 15 IU/L (ref 0–32)
AST: 20 IU/L (ref 0–40)
Albumin: 4.6 g/dL (ref 3.7–4.7)
Alkaline Phosphatase: 72 IU/L (ref 44–121)
BUN/Creatinine Ratio: 15 (ref 12–28)
BUN: 15 mg/dL (ref 8–27)
Bilirubin Total: 0.9 mg/dL (ref 0.0–1.2)
CO2: 24 mmol/L (ref 20–29)
Calcium: 10 mg/dL (ref 8.7–10.3)
Chloride: 101 mmol/L (ref 96–106)
Creatinine, Ser: 0.97 mg/dL (ref 0.57–1.00)
Globulin, Total: 3 g/dL (ref 1.5–4.5)
Glucose: 86 mg/dL (ref 70–99)
Potassium: 5.4 mmol/L — ABNORMAL HIGH (ref 3.5–5.2)
Sodium: 141 mmol/L (ref 134–144)
Total Protein: 7.6 g/dL (ref 6.0–8.5)
eGFR: 59 mL/min/1.73 — ABNORMAL LOW (ref >59)

## 2024-07-27 LAB — CBC
Hematocrit: 43.4 % (ref 34.0–46.6)
Hemoglobin: 14.1 g/dL (ref 11.1–15.9)
MCH: 29.9 pg (ref 26.6–33.0)
MCHC: 32.5 g/dL (ref 31.5–35.7)
MCV: 92 fL (ref 79–97)
Platelets: 231 x10E3/uL (ref 150–450)
RBC: 4.71 x10E6/uL (ref 3.77–5.28)
RDW: 12.1 % (ref 11.7–15.4)
WBC: 6.6 x10E3/uL (ref 3.4–10.8)

## 2024-07-27 LAB — HEMOGLOBIN A1C
Est. average glucose Bld gHb Est-mCnc: 105 mg/dL
Hgb A1c MFr Bld: 5.3 % (ref 4.8–5.6)

## 2024-07-27 LAB — VITAMIN D 25 HYDROXY (VIT D DEFICIENCY, FRACTURES): Vit D, 25-Hydroxy: 55.5 ng/mL (ref 30.0–100.0)

## 2024-07-27 LAB — TSH: TSH: 1.52 u[IU]/mL (ref 0.450–4.500)

## 2024-07-31 DIAGNOSIS — M25511 Pain in right shoulder: Secondary | ICD-10-CM | POA: Diagnosis not present

## 2024-07-31 DIAGNOSIS — M791 Myalgia, unspecified site: Secondary | ICD-10-CM | POA: Diagnosis not present

## 2024-08-01 ENCOUNTER — Encounter: Payer: Self-pay | Admitting: Internal Medicine

## 2024-08-01 DIAGNOSIS — I10 Essential (primary) hypertension: Secondary | ICD-10-CM

## 2024-08-01 DIAGNOSIS — Z79899 Other long term (current) drug therapy: Secondary | ICD-10-CM

## 2024-08-01 NOTE — Telephone Encounter (Signed)
 Please repeat BMET to recheck K     I think pt needs a little something to get BP in line more    K will determine what

## 2024-08-02 DIAGNOSIS — I10 Essential (primary) hypertension: Secondary | ICD-10-CM | POA: Diagnosis not present

## 2024-08-03 ENCOUNTER — Ambulatory Visit: Payer: Self-pay | Admitting: Internal Medicine

## 2024-08-03 LAB — BASIC METABOLIC PANEL WITH GFR
BUN/Creatinine Ratio: 22 (ref 12–28)
BUN: 20 mg/dL (ref 8–27)
CO2: 25 mmol/L (ref 20–29)
Calcium: 9.6 mg/dL (ref 8.7–10.3)
Chloride: 100 mmol/L (ref 96–106)
Creatinine, Ser: 0.89 mg/dL (ref 0.57–1.00)
Glucose: 61 mg/dL — ABNORMAL LOW (ref 70–99)
Potassium: 4.6 mmol/L (ref 3.5–5.2)
Sodium: 138 mmol/L (ref 134–144)
eGFR: 65 mL/min/1.73 (ref >59)

## 2024-08-06 MED ORDER — HYDROCHLOROTHIAZIDE 12.5 MG PO CAPS: 12.5000 mg | ORAL_CAPSULE | Freq: Every day | ORAL | 3 refills | Status: DC

## 2024-08-06 NOTE — Addendum Note (Signed)
 Addended by: VICCI ROXIE CROME on: 08/06/2024 08:15 AM   Modules accepted: Orders

## 2024-08-08 ENCOUNTER — Other Ambulatory Visit: Payer: Self-pay | Admitting: Internal Medicine

## 2024-08-27 ENCOUNTER — Encounter: Payer: Self-pay | Admitting: Internal Medicine

## 2024-08-27 ENCOUNTER — Non-Acute Institutional Stay: Admitting: Internal Medicine

## 2024-08-27 VITALS — BP 132/84 | HR 67 | Temp 97.3°F | Ht 65.0 in | Wt 138.6 lb

## 2024-08-27 DIAGNOSIS — R52 Pain, unspecified: Secondary | ICD-10-CM | POA: Diagnosis not present

## 2024-08-27 DIAGNOSIS — I10 Essential (primary) hypertension: Secondary | ICD-10-CM | POA: Diagnosis not present

## 2024-08-27 MED ORDER — METHOCARBAMOL 500 MG PO TABS: 250.0000 mg | ORAL_TABLET | Freq: Two times a day (BID) | ORAL | 0 refills | Status: DC | PRN

## 2024-08-28 NOTE — Progress Notes (Signed)
 Cathy Simpson                                          MRN: [id number]   08/28/2024   The [facility] Quality Team Specialist reviewed this patient medical record for the purposes of chart review for care gap closure. The following were reviewed: chart review for care gap closure-controlling blood pressure.    [facility] Quality Team

## 2024-08-28 NOTE — Progress Notes (Signed)
 Location: [facility] features editor of Service:  Clinic (12)  Provider:   Code Status:  Goals of Care:     02/12/2024    2:49 PM  Advanced Directives  Does Patient Have a Medical Advance Directive? Yes  Type of Estate agent of [place];Living will;Out of facility DNR (pink MOST or yellow form)  Does patient want to make changes to medical advance directive? No - Patient declined  Copy of Healthcare Power of Attorney in Chart? No - copy requested     Chief Complaint  Patient presents with   Back Pain    Patient states its been going on 4 days.    HPI: Patient is a 83 y.o. female seen today for an acute visit for Pain on her Left side of scapular region  Discussed the use of AI scribe software for clinical note transcription with the patient, who gave verbal consent to proceed.  History of Present Illness   Cathy Simpson is an 83 year old female with a history of arthritis who presents with left-sided scapular  pain.  She has experienced left-sided scapular pain for four to five days. The pain is deep and worsens with deep breathing and arm movement. It resembles a previous episode on the right side, which was minimally relieved by an injection. There are no recent falls, injuries, or activities that could have triggered the pain. She suspects it may be due to sleeping in an awkward position. She takes Tylenol two to three times daily for relief.  Her current medications include Eliquis , metoprolol , and recently started amlodipine  for hypertension, which has slightly lowered her blood pressure. She experiences wheezing after receiving her flu shot, which she attributes to vaccine sensitivity.       Past Medical History:  Diagnosis Date   Abnormal posture    Aortic incompetence    Basal cell carcinoma    BPPV (benign paroxysmal positional vertigo)    Chest discomfort    Cobalamin deficiency    Fuchs' corneal dystrophy    Gastritis     HTN (hypertension)    Hyperlipidemia    Insomnia    Light headed    Morbid obesity (HCC)    Neck pain    Nonrheumatic aortic (valve) insufficiency    Osteopenia    Overactive bladder    Palpitations    Paroxysmal atrial fibrillation (HCC)    Reactive hypoglycemia    Scar of lip    Senile purpura    Seroma due to trauma    Tendon pain    Venous insufficiency     Past Surgical History:  Procedure Laterality Date   AUGMENTATION MAMMAPLASTY     THYROIDECTOMY, PARTIAL      Allergies  Allergen Reactions   Codeine Nausea And Vomiting, Other (See Comments) and Nausea Only    Other reaction(s): GI Intolerance Other reaction(s): GI Intolerance Other reaction(s): GI Intolerance    Penicillins Rash and Other (See Comments)    Other reaction(s): Unknown Other reaction(s): Unknown Other reaction(s): Unknown     Outpatient Encounter Medications as of 08/27/2024  Medication Sig   amLODipine  (NORVASC ) 2.5 MG tablet Take 1 tablet (2.5 mg total) by mouth daily.   Calcium-Vitamins C & D (CALCIUM/C/D PO) Take by mouth daily.   ELIQUIS  5 MG TABS tablet TAKE 1 TABLET BY MOUTH TWICE A DAY   flecainide  (TAMBOCOR ) 50 MG tablet TAKE 1 TABLET BY MOUTH TWICE A DAY   methocarbamol (ROBAXIN) 500 MG tablet  Take 0.5 tablets (250 mg total) by mouth 2 (two) times daily as needed for muscle spasms.   metoprolol  succinate (TOPROL -XL) 25 MG 24 hr tablet TAKE 1/2 TABLET BY MOUTH DAILY   hydrochlorothiazide  (MICROZIDE ) 12.5 MG capsule Take 1 capsule (12.5 mg total) by mouth daily. (Patient not taking: Reported on 08/27/2024)   No facility-administered encounter medications on file as of 08/27/2024.    Review of Systems:  Review of Systems  Constitutional:  Negative for activity change and appetite change.  HENT: Negative.    Respiratory:  Negative for cough and shortness of breath.   Cardiovascular:  Negative for leg swelling.  Gastrointestinal:  Negative for constipation.  Genitourinary: Negative.    Musculoskeletal:  Negative for arthralgias, gait problem and myalgias.  Skin: Negative.   Neurological:  Negative for dizziness and weakness.  Psychiatric/Behavioral:  Negative for confusion, dysphoric mood and sleep disturbance.     Health Maintenance  Topic Date Due   Influenza Vaccine  06/28/2024   COVID-19 Vaccine (4 - 2025-26 season) 07/29/2024   Medicare Annual Wellness (AWV)  11/22/2024 (Originally [birth year])   DTaP/Tdap/Td (3 - Td or Tdap) 07/11/2027   Pneumococcal Vaccine: 50+ Years  Completed   DEXA SCAN  Completed   Zoster Vaccines- Shingrix  Completed   HPV VACCINES  Aged Out   Meningococcal B Vaccine  Aged Out    Physical Exam: Vitals:   08/27/24 1127  BP: 132/84  Pulse: 67  Temp: (!) 97.3 F (36.3 C)  SpO2: 99%  Weight: 138 lb 9.6 oz (62.9 kg)  Height: 5' 5 (1.651 m)   Body mass index is 23.06 kg/m. Physical Exam Vitals reviewed.  HENT:     Head: Normocephalic.  Musculoskeletal:     Comments: Not tender on her scapular region No Bruise No rashes no Swelling  Neurological:     General: No focal deficit present.     Mental Status: She is alert and oriented to person, place, and time.  Psychiatric:        Mood and Affect: Mood normal.        Thought Content: Thought content normal.     Labs reviewed: Basic Metabolic Panel: Recent Labs    09/05/23 0000 07/26/24 1018 08/02/24 1100  NA 138 141 138  K 3.8 5.4* 4.6  CL 100 101 100  CO2 25* 24 25  GLUCOSE  --  86 61*  BUN 13 15 20   CREATININE 1.1 0.97 0.89  CALCIUM 9.9 10.0 9.6  TSH 2.02 1.520  --    Liver Function Tests: Recent Labs    09/05/23 0000 07/26/24 1018  AST 27 20  ALT 15 15  ALKPHOS 69 72  BILITOT  --  0.9  PROT  --  7.6  ALBUMIN 4.8 4.6   No results for input(s): LIPASE, AMYLASE in the last 8760 hours. No results for input(s): AMMONIA in the last 8760 hours. CBC: Recent Labs    09/05/23 0000 07/26/24 1018  WBC 8.0 6.6  HGB 14.2 14.1  HCT 43 43.4  MCV   --  92  PLT 229 231   Lipid Panel: Recent Labs    09/05/23 0000  CHOL 149  HDL 103*  LDLCALC 33  TRIG 65   Lab Results  Component Value Date   HGBA1C 5.3 07/26/2024    Procedures since last visit: No results found.  Assessment/Plan   Assessment and Plan    Left-sided Scapular pain Acute musculoskeletal pain likely from muscle strain. Methocarbamol selected  due to compatibility with current medications. - Prescribed methocarbamol 500 mg, take half a pill twice daily, full pill at night if needed. - Continue Tylenol as needed. - Order x-ray if pain persists by Monday.  Hypertension Recent addition of Norvasc        Labs/tests ordered:  * No order type specified * Next appt:  01/14/2025

## 2024-08-30 ENCOUNTER — Ambulatory Visit: Admitting: Internal Medicine

## 2024-09-01 NOTE — Progress Notes (Unsigned)
 Cardiology Office Note    Date:  09/03/2024  ID:  Cathy Simpson, DOB [birth year], MRN [id number] PCP:  Charlanne Fredia CROME, MD  Cardiologist:  Vina Gull, MD  Electrophysiologist:  None   Chief Complaint: Follow up for HTN   History of Present Illness: .    Cathy Simpson is a 83 y.o. female with visit-pertinent history of paroxysmal atrial fibrillation, atypical chest pain, hypertension, venous insufficiency.  Per notes patient was previously followed by [facility] in [facility]  Previously patient was on Multaq for atrial fibrillation, developed weakness and was switched to flecainide .  Lexiscan Myoview was negative for ischemia in 2022.  Patient establish care with Dr. Vina Gull in November 2023.  She was last in the clinic on 07/26/2024, reported rare brief fluttering sensation.  Patient noted some right parasternal chest pain that can last awhile not associated with activity.  Denied any exacerbating factors.  It was noted the patient's blood pressure was significantly elevated at office visit, she was started on amlodipine  2.5 mg daily.  Recommended 4-week follow-up for review.  Today she presents for follow-up.  She reports that she has been doing well overall.  She denies any chest pain, shortness of breath, orthopnea or PND.  Patient reports that her blood pressure has been very well-controlled at home, reviewed patient's blood pressure log, in the last month her blood pressure has consistently been less than 130/80, in the last 2 weeks typically is less than 120/80.  Patient reports that she is overall tolerating her blood pressures well, notes that she did start on methocarbamol last week resulting in increased dizziness, has since discontinued the medication with improvement in symptoms.  Patient reports that she did not start on hydrochlorothiazide  as she felt her blood pressure was well-controlled and her previously noted flushing and urinary frequency resolved.  Patient does note  problems with swollen ankles at the end of the day, resolves overnight, reports that she does not regularly wear her compression stockings.  Patient currently resides at [facility].    Labwork independently reviewed: 08/02/2024: Sodium 138, potassium 4.6, creatinine 0.89 07/25/2024: Hemoglobin 14.1, hematocrit 43.4, TSH 1.52 ROS: .   Today she denies chest pain, shortness of breath, fatigue, palpitations, melena, hematuria, hemoptysis, diaphoresis, weakness, presyncope, syncope, orthopnea, and PND.  All other systems are reviewed and otherwise negative. Studies Reviewed: SABRA   EKG:  EKG is not ordered today.  CV Studies: Cardiac studies reviewed are outlined and summarized above. Otherwise please see EMR for full report.     Current Reported Medications:.    Current Meds  Medication Sig   amLODipine  (NORVASC ) 2.5 MG tablet Take 1 tablet (2.5 mg total) by mouth daily.   Calcium-Vitamins C & D (CALCIUM/C/D PO) Take by mouth daily.   ELIQUIS  5 MG TABS tablet TAKE 1 TABLET BY MOUTH TWICE A DAY   flecainide  (TAMBOCOR ) 50 MG tablet TAKE 1 TABLET BY MOUTH TWICE A DAY   methocarbamol (ROBAXIN) 500 MG tablet Take 0.5 tablets (250 mg total) by mouth 2 (two) times daily as needed for muscle spasms.   metoprolol  succinate (TOPROL -XL) 25 MG 24 hr tablet TAKE 1/2 TABLET BY MOUTH DAILY    Physical Exam:    VS:  BP 132/70   Pulse 65   Ht 5' 5 (1.651 m)   Wt 140 lb 6.4 oz (63.7 kg)   SpO2 97%   BMI 23.36 kg/m    Wt Readings from Last 3 Encounters:  09/03/24 140 lb  6.4 oz (63.7 kg)  08/27/24 138 lb 9.6 oz (62.9 kg)  07/26/24 136 lb 3.2 oz (61.8 kg)    GEN: Well nourished, well developed in no acute distress NECK: No JVD; No carotid bruits CARDIAC: RRR, no murmurs, rubs, gallops RESPIRATORY:  Clear to auscultation without rales, wheezing or rhonchi  ABDOMEN: Soft, non-tender, non-distended EXTREMITIES:  No edema; No acute deformity     Asessement and Plan:.    Hx of PAF:  Well controlled on flecainide  and metoprolol . She denies any palpitations or feeling of atrial fibrillation. She denies any bleeding problems on Eliquis .  Recent lab work reassuring.  Continue Eliquis  5 mg twice daily, flecainide  50 mg twice a day and metoprolol  succinate 12.5 mg daily.  CHA2DS2-VASc Score = 4 [CHF History: 0, HTN History: 1, Diabetes History: 0, Stroke History: 0, Vascular Disease History: 0, Age Score: 2, Gender Score: 1].  Therefore, the patient's annual risk of stroke is 4.8 %.      HTN: Blood pressure today 132/70.  On reviewing patient's blood pressure log her blood pressure has been very well-controlled at home, for the last month is consistently been less than 130/80, in the last 2 weeks consistently less than 120/80.  Patient brought in her home blood pressure cuff today, her cuff is actually reading around 10 mmHg higher with systolic and diastolic readings compared to manual cuff in office.  Patient denies any dizziness, lightheadedness, presyncope or syncope with new medications, does note that she had some dizziness last week after taking methocarbamol, has not taken medication further with resolution in symptoms.  Patient reports that she never started hydrochlorothiazide , would prefer not to start at this time as blood pressures are well-controlled.  Encouraged patient to continue monitoring her blood pressure at home and keep in mind the slight difference in manual versus automatic cuff.  Continue amlodipine  2.5 mg daily and metoprolol  succinate 12.5 mg daily.  Lower extremity edema: Patient notes long history of intermittent ankle swelling that typically progresses throughout the day and resolves overnight.  Patient notes that she tries to elevate her legs however remains busy and is unable to consistently.  She notes that she does not regularly wear compression stockings, prefers not to use diuretics.  She appears euvolemic and well compensated on exam today.  Encouraged  patient to maintain a low-sodium diet, elevate legs when able and use compression stockings daily.   Disposition: F/u with Dr.  Okey in six months or sooner per patient request.   Signed, Binyamin Nelis D Ceira Hoeschen, NP

## 2024-09-03 ENCOUNTER — Ambulatory Visit: Attending: Cardiology | Admitting: Cardiology

## 2024-09-03 ENCOUNTER — Encounter: Payer: Self-pay | Admitting: Cardiology

## 2024-09-03 VITALS — BP 132/70 | HR 65 | Ht 65.0 in | Wt 140.4 lb

## 2024-09-03 DIAGNOSIS — E7800 Pure hypercholesterolemia, unspecified: Secondary | ICD-10-CM

## 2024-09-03 DIAGNOSIS — I480 Paroxysmal atrial fibrillation: Secondary | ICD-10-CM | POA: Diagnosis not present

## 2024-09-03 DIAGNOSIS — Z79899 Other long term (current) drug therapy: Secondary | ICD-10-CM

## 2024-09-03 DIAGNOSIS — I10 Essential (primary) hypertension: Secondary | ICD-10-CM | POA: Diagnosis not present

## 2024-09-03 NOTE — Patient Instructions (Signed)
 Medication Instructions:  Your physician recommends that you continue on your current medications as directed. Please refer to the Current Medication list given to you today.  *If you need a refill on your cardiac medications before your next appointment, please call your pharmacy*  Lab Work: NONE If you have labs (blood work) drawn today and your tests are completely normal, you will receive your results only by: MyChart Message (if you have MyChart) OR A paper copy in the mail If you have any lab test that is abnormal or we need to change your treatment, we will call you to review the results.  Testing/Procedures: NONE  Follow-Up: At [facility], you and your health needs are our priority.  As part of our continuing mission to provide you with exceptional heart care, our providers are all part of one team.  This team includes your primary Cardiologist (physician) and Advanced Practice Providers or APPs (Physician Assistants and Nurse Practitioners) who all work together to provide you with the care you need, when you need it.  Your next appointment:   6 month(s)  Provider:   Vina Gull, MD    We recommend signing up for the patient portal called MyChart.  Sign up information is provided on this After Visit Summary.  MyChart is used to connect with patients for Virtual Visits (Telemedicine).  Patients are able to view lab/test results, encounter notes, upcoming appointments, etc.  Non-urgent messages can be sent to your provider as well.   To learn more about what you can do with MyChart, go to [email or web address].

## 2024-09-04 ENCOUNTER — Encounter: Payer: Self-pay | Admitting: Cardiology

## 2024-09-22 ENCOUNTER — Other Ambulatory Visit: Payer: Self-pay | Admitting: Internal Medicine

## 2024-10-15 ENCOUNTER — Ambulatory Visit (HOSPITAL_BASED_OUTPATIENT_CLINIC_OR_DEPARTMENT_OTHER): Payer: Medicare PPO | Admitting: Radiology

## 2024-10-22 ENCOUNTER — Other Ambulatory Visit: Payer: Self-pay | Admitting: Internal Medicine

## 2024-10-22 NOTE — Telephone Encounter (Signed)
 Pharmacy requested refill Pended Rx and sent to Dr. Chales Abrahams for approval.

## 2024-10-30 DIAGNOSIS — H18523 Epithelial (juvenile) corneal dystrophy, bilateral: Secondary | ICD-10-CM | POA: Diagnosis not present

## 2024-10-30 DIAGNOSIS — H18513 Endothelial corneal dystrophy, bilateral: Secondary | ICD-10-CM | POA: Diagnosis not present

## 2024-10-30 DIAGNOSIS — Z961 Presence of intraocular lens: Secondary | ICD-10-CM | POA: Diagnosis not present

## 2024-11-08 ENCOUNTER — Other Ambulatory Visit: Payer: Self-pay | Admitting: Internal Medicine

## 2024-11-08 ENCOUNTER — Inpatient Hospital Stay (HOSPITAL_BASED_OUTPATIENT_CLINIC_OR_DEPARTMENT_OTHER)
Admission: RE | Admit: 2024-11-08 | Discharge: 2024-11-08 | Disposition: A | Source: Ambulatory Visit | Attending: Internal Medicine | Admitting: Internal Medicine

## 2024-11-08 ENCOUNTER — Encounter (HOSPITAL_BASED_OUTPATIENT_CLINIC_OR_DEPARTMENT_OTHER): Payer: Self-pay | Admitting: Radiology

## 2024-11-08 DIAGNOSIS — E2839 Other primary ovarian failure: Secondary | ICD-10-CM

## 2024-11-08 DIAGNOSIS — I10 Essential (primary) hypertension: Secondary | ICD-10-CM

## 2024-11-08 DIAGNOSIS — E890 Postprocedural hypothyroidism: Secondary | ICD-10-CM

## 2024-11-08 DIAGNOSIS — I480 Paroxysmal atrial fibrillation: Secondary | ICD-10-CM

## 2024-11-08 DIAGNOSIS — Z1231 Encounter for screening mammogram for malignant neoplasm of breast: Secondary | ICD-10-CM

## 2025-01-14 ENCOUNTER — Encounter: Payer: Self-pay | Admitting: Internal Medicine
